# Patient Record
Sex: Female | Born: 1965 | Race: White | Hispanic: No | Marital: Married
Health system: Southern US, Community
[De-identification: ages and names within clinical notes are randomized; demographics above are authoritative.]

---

## 2014-08-19 ENCOUNTER — Inpatient Hospital Stay
Admission: AD | Admit: 2014-08-19 | Payer: Self-pay | Source: Other Acute Inpatient Hospital | Admitting: Internal Medicine

## 2014-08-19 ENCOUNTER — Inpatient Hospital Stay (HOSPITAL_COMMUNITY)
Admission: AD | Admit: 2014-08-19 | Discharge: 2014-09-13 | DRG: 207 | Disposition: E | Payer: Medicaid Other | Source: Other Acute Inpatient Hospital | Attending: Internal Medicine | Admitting: Internal Medicine

## 2014-08-19 DIAGNOSIS — E876 Hypokalemia: Secondary | ICD-10-CM | POA: Diagnosis not present

## 2014-08-19 DIAGNOSIS — Z931 Gastrostomy status: Secondary | ICD-10-CM | POA: Diagnosis not present

## 2014-08-19 DIAGNOSIS — R4020 Unspecified coma: Secondary | ICD-10-CM | POA: Diagnosis not present

## 2014-08-19 DIAGNOSIS — J156 Pneumonia due to other aerobic Gram-negative bacteria: Secondary | ICD-10-CM | POA: Diagnosis not present

## 2014-08-19 DIAGNOSIS — R739 Hyperglycemia, unspecified: Secondary | ICD-10-CM | POA: Diagnosis not present

## 2014-08-19 DIAGNOSIS — R1310 Dysphagia, unspecified: Secondary | ICD-10-CM | POA: Diagnosis present

## 2014-08-19 DIAGNOSIS — E872 Acidosis: Secondary | ICD-10-CM | POA: Diagnosis not present

## 2014-08-19 DIAGNOSIS — G931 Anoxic brain damage, not elsewhere classified: Secondary | ICD-10-CM | POA: Diagnosis present

## 2014-08-19 DIAGNOSIS — J189 Pneumonia, unspecified organism: Secondary | ICD-10-CM

## 2014-08-19 DIAGNOSIS — G253 Myoclonus: Secondary | ICD-10-CM | POA: Diagnosis present

## 2014-08-19 DIAGNOSIS — K7200 Acute and subacute hepatic failure without coma: Secondary | ICD-10-CM | POA: Diagnosis present

## 2014-08-19 DIAGNOSIS — Z515 Encounter for palliative care: Secondary | ICD-10-CM

## 2014-08-19 DIAGNOSIS — D649 Anemia, unspecified: Secondary | ICD-10-CM | POA: Diagnosis present

## 2014-08-19 DIAGNOSIS — G936 Cerebral edema: Secondary | ICD-10-CM | POA: Diagnosis present

## 2014-08-19 DIAGNOSIS — D638 Anemia in other chronic diseases classified elsewhere: Secondary | ICD-10-CM | POA: Diagnosis not present

## 2014-08-19 DIAGNOSIS — E039 Hypothyroidism, unspecified: Secondary | ICD-10-CM | POA: Diagnosis present

## 2014-08-19 DIAGNOSIS — R40243 Glasgow coma scale score 3-8: Secondary | ICD-10-CM

## 2014-08-19 DIAGNOSIS — N390 Urinary tract infection, site not specified: Secondary | ICD-10-CM | POA: Diagnosis not present

## 2014-08-19 DIAGNOSIS — I469 Cardiac arrest, cause unspecified: Secondary | ICD-10-CM

## 2014-08-19 DIAGNOSIS — Z930 Tracheostomy status: Secondary | ICD-10-CM

## 2014-08-19 DIAGNOSIS — Z66 Do not resuscitate: Secondary | ICD-10-CM

## 2014-08-19 DIAGNOSIS — J9690 Respiratory failure, unspecified, unspecified whether with hypoxia or hypercapnia: Secondary | ICD-10-CM | POA: Insufficient documentation

## 2014-08-19 DIAGNOSIS — Y95 Nosocomial condition: Secondary | ICD-10-CM | POA: Diagnosis not present

## 2014-08-19 DIAGNOSIS — Z880 Allergy status to penicillin: Secondary | ICD-10-CM

## 2014-08-19 DIAGNOSIS — J158 Pneumonia due to other specified bacteria: Secondary | ICD-10-CM | POA: Diagnosis not present

## 2014-08-19 DIAGNOSIS — J9620 Acute and chronic respiratory failure, unspecified whether with hypoxia or hypercapnia: Secondary | ICD-10-CM

## 2014-08-19 DIAGNOSIS — J9621 Acute and chronic respiratory failure with hypoxia: Secondary | ICD-10-CM | POA: Diagnosis present

## 2014-08-19 DIAGNOSIS — I248 Other forms of acute ischemic heart disease: Secondary | ICD-10-CM | POA: Diagnosis not present

## 2014-08-19 DIAGNOSIS — J9600 Acute respiratory failure, unspecified whether with hypoxia or hypercapnia: Secondary | ICD-10-CM | POA: Insufficient documentation

## 2014-08-19 DIAGNOSIS — R092 Respiratory arrest: Secondary | ICD-10-CM | POA: Diagnosis present

## 2014-08-19 LAB — MRSA PCR SCREENING: MRSA by PCR: NEGATIVE

## 2014-08-19 MED ORDER — ONDANSETRON HCL 4 MG/2ML IJ SOLN
4.0000 mg | Freq: Four times a day (QID) | INTRAMUSCULAR | Status: DC | PRN
  Administered 2014-08-22: 4 mg via INTRAVENOUS
  Filled 2014-08-19: qty 2

## 2014-08-19 MED ORDER — HEPARIN SODIUM (PORCINE) 5000 UNIT/ML IJ SOLN
5000.0000 [IU] | Freq: Three times a day (TID) | INTRAMUSCULAR | Status: DC
  Administered 2014-08-20 – 2014-08-25 (×16): 5000 [IU] via SUBCUTANEOUS
  Filled 2014-08-19 (×19): qty 1

## 2014-08-19 MED ORDER — SODIUM CHLORIDE 0.9 % IJ SOLN: 10.0000 mL | INTRAMUSCULAR | Status: DC | PRN

## 2014-08-19 MED ORDER — NOREPINEPHRINE BITARTRATE 1 MG/ML IV SOLN
0.0000 ug/min | INTRAVENOUS | Status: DC
  Filled 2014-08-19: qty 4

## 2014-08-19 MED ORDER — SODIUM CHLORIDE 0.9 % IV SOLN: 250.0000 mL | INTRAVENOUS | Status: DC | PRN

## 2014-08-19 MED ORDER — SODIUM CHLORIDE 0.9 % IJ SOLN
10.0000 mL | Freq: Two times a day (BID) | INTRAMUSCULAR | Status: DC
  Administered 2014-08-20: 10 mL
  Administered 2014-08-20: 30 mL
  Administered 2014-08-21 – 2014-08-22 (×3): 10 mL
  Administered 2014-08-22: 20 mL
  Administered 2014-08-23 – 2014-08-24 (×4): 10 mL

## 2014-08-19 MED ORDER — LACTATED RINGERS IV SOLN
INTRAVENOUS | Status: DC
  Administered 2014-08-20: 03:00:00 via INTRAVENOUS

## 2014-08-19 MED ORDER — ALBUTEROL SULFATE (2.5 MG/3ML) 0.083% IN NEBU: 2.5000 mg | INHALATION_SOLUTION | RESPIRATORY_TRACT | Status: DC | PRN

## 2014-08-19 MED ORDER — SODIUM CHLORIDE 0.9 % IV SOLN
2000.0000 mL | Freq: Once | INTRAVENOUS | Status: AC
  Administered 2014-08-20: 1500 mL via INTRAVENOUS

## 2014-08-19 MED ORDER — MIDAZOLAM HCL 2 MG/2ML IJ SOLN
2.0000 mg | INTRAMUSCULAR | Status: DC | PRN
  Administered 2014-08-20 (×2): 2 mg via INTRAVENOUS
  Filled 2014-08-19 (×3): qty 2

## 2014-08-19 MED ORDER — HEPARIN SODIUM (PORCINE) 5000 UNIT/ML IJ SOLN: 5000.0000 [IU] | Freq: Three times a day (TID) | INTRAMUSCULAR | Status: DC

## 2014-08-19 MED ORDER — ACETAMINOPHEN 325 MG PO TABS
650.0000 mg | ORAL_TABLET | ORAL | Status: DC | PRN
  Administered 2014-08-24: 650 mg via ORAL
  Filled 2014-08-19: qty 2

## 2014-08-19 MED ORDER — FENTANYL CITRATE 0.05 MG/ML IJ SOLN
100.0000 ug | INTRAMUSCULAR | Status: DC | PRN
  Administered 2014-08-20 – 2014-08-21 (×3): 100 ug via INTRAVENOUS
  Filled 2014-08-19 (×3): qty 2

## 2014-08-19 MED ORDER — SODIUM CHLORIDE 0.9 % IV BOLUS (SEPSIS)
500.0000 mL | Freq: Once | INTRAVENOUS | Status: AC
  Administered 2014-08-19: 500 mL via INTRAVENOUS

## 2014-08-19 MED ORDER — FENTANYL CITRATE 0.05 MG/ML IJ SOLN
100.0000 ug | INTRAMUSCULAR | Status: AC | PRN
  Administered 2014-08-20 (×3): 100 ug via INTRAVENOUS
  Filled 2014-08-19 (×3): qty 2

## 2014-08-19 MED ORDER — FAMOTIDINE IN NACL 20-0.9 MG/50ML-% IV SOLN
20.0000 mg | Freq: Two times a day (BID) | INTRAVENOUS | Status: DC
  Administered 2014-08-20 – 2014-08-24 (×11): 20 mg via INTRAVENOUS
  Filled 2014-08-19 (×13): qty 50

## 2014-08-19 MED ORDER — MIDAZOLAM HCL 2 MG/2ML IJ SOLN
2.0000 mg | INTRAMUSCULAR | Status: DC | PRN
  Administered 2014-08-20 – 2014-08-21 (×2): 2 mg via INTRAVENOUS
  Filled 2014-08-19: qty 2

## 2014-08-19 NOTE — Procedures (Signed)
Name: Wynn Bankerammie Kopko MRN: [id number] DOB: Jan 04, 1966  DOS:  PROCEDURE NOTE  Procedure:  Arterial catheter placement.  Indications:  Need for invasive hemodynamic monitoring / frequent arterial blood gases measurement.  Consent:  Consent was implied due to the emergency nature of the procedure.  Procedure summary:  The patient was identified as Wynn Bankerammie Keagle and safety timeout was performed. Sterile technique was used. The patient's left groin wrist was prepped using chlorhexidine scrub and the field was draped in usual sterile fashion with protective barrier. The left femoral artery was cannulated without difficulty. Blood was aspirated and the catheter was flushed with normal saline without difficulty. Good arterial waveform was obtained. The catheter was secured into place with sterile dressing.  Complications:  No immediate complications were noted.  Estimated blood loss:  Less then 5 mL  Wadie LessenMario Sherice Ijames, M.D. Pulmonary and Critical Care Medicine [facility] Cell: [phone] Pager: [phone]  08/28/2014, 11:43 PM

## 2014-08-19 NOTE — Progress Notes (Signed)
eLink Physician-Brief Progress Note Patient Name: Miranda Miranda DOB: 01/21/1966 MRN: [id number]   Date of Service  09/08/2014  HPI/Events of Note  [age] F [facility].  Now hypotensive per cuff pressure at 62/52 (55)  eICU Interventions  Plan: 500 cc NS bolus for BP support Place aline for HD monitoring Fellow to see patient     Intervention Category Major Interventions: Hypotension - evaluation and management  DETERDING,ELIZABETH 09/10/2014, 10:25 PM

## 2014-08-19 NOTE — Procedures (Signed)
Central Venous Catheter Insertion Procedure Note Wynn Bankerammie Hornak [id number] Dec 11, 1965  Procedure: Insertion of Central Venous Catheter Indications: Drug and/or fluid administration and Frequent blood sampling  Procedure Details Consent: Unable to obtain consent because of altered level of consciousness. Time Out: Verified patient identification, verified procedure, site/side was marked, verified correct patient position, special equipment/implants available, medications/allergies/relevent history reviewed, required imaging and test results available.  Performed  Maximum sterile technique was used including antiseptics, cap, gloves, gown, hand hygiene, mask and sheet. Skin prep: Chlorhexidine; local anesthetic administered A antimicrobial bonded/coated triple lumen catheter was placed in the left internal jugular vein using the Seldinger technique.  Evaluation Blood flow good Complications: No apparent complications Patient did tolerate procedure well. Chest X-ray ordered to verify placement.  CXR: pending.  Cung Masterson 09/05/2014, 11:40 PM

## 2014-08-19 NOTE — H&P (Signed)
PULMONARY / CRITICAL CARE MEDICINE   Name: Miranda Miranda MRN: [id number] DOB: [birth year]    ADMISSION DATE:  08/27/2014 CONSULTATION DATE:  09/09/2014  REFERRING MD :  Duke [facility]  CHIEF COMPLAINT:  S/p arrest  INITIAL PRESENTATION:   STUDIES:    SIGNIFICANT EVENTS: Cardiorespiratory arrest 08/30/2014   HISTORY OF PRESENT ILLNESS:  Patient unable to give history, history taken from chart review. Basically this is a 4F who was in another hospital for about 1 month for multiple reasons: strep PNA with septic shock, with failure to wean from vent likely from deconditioning/myopathy and resulting in tracheostomy. Also known to have hypothyroidism. Also had dysphagia resulting in PEG tube insertion. Apparently she was just discharged and [facility] and receiving 6L oxygen via cannula to her trache. Later on about 1 hour later staff member found her unresponsive and possibly had no pulse. EMS arrived, 1 epi given and ambubagging started with ROSC. Blood sugar 180, no fever. Now in ICU with borderline BP. No fever. Initial ABG showed hypercarbic resp failure  PAST MEDICAL HISTORY :   has no past medical history on file.  has no past surgical history on file. Prior to Admission medications   Not on File   Allergies not on file  FAMILY HISTORY:  has no family status information on file.  SOCIAL HISTORY:    REVIEW OF SYSTEMS:  Unable to obtain due to altered mental status  SUBJECTIVE:   VITAL SIGNS: Pulse Rate:  [30-85] 85 (02/05 2245) Resp:  [19-26] 26 (02/05 2245) BP: (67-89)/(35-61) 67/49 mmHg (02/05 2245) SpO2:  [100 %] 100 % (02/05 2245) FiO2 (%):  [100 %] 100 % (02/05 2205) HEMODYNAMICS:   VENTILATOR SETTINGS: Vent Mode:  [-] PRVC FiO2 (%):  [100 %] 100 % Set Rate:  [26 bmp] 26 bmp Vt Set:  [500 mL] 500 mL PEEP:  [5 cmH20] 5 cmH20 INTAKE / OUTPUT: No intake or output data in the 24 hours ending 02/20/2015 2344  PHYSICAL EXAMINATION: General:  Trached,  nonverbal Neuro:  Nonverbal, pupils equal and reactive, myoclonic jerks on all extremities HEENT:  trahe present, looks clear, no facial trauma Cardiovascular:  RRR, no loud murmur Lungs:  Clear, no wheeze Abdomen:  Soft, no guardig Musculoskeletal:  Trace edema, no gross deformities Skin:  No ecchymosis  LABS:  CBC No results for input(s): WBC, HGB, HCT, PLT in the last 168 hours. Coag's No results for input(s): APTT, INR in the last 168 hours. BMET No results for input(s): NA, K, CL, CO2, BUN, CREATININE, GLUCOSE in the last 168 hours. Electrolytes No results for input(s): CALCIUM, MG, PHOS in the last 168 hours. Sepsis Markers No results for input(s): LATICACIDVEN, PROCALCITON, O2SATVEN in the last 168 hours. ABG No results for input(s): PHART, PCO2ART, PO2ART in the last 168 hours. Liver Enzymes No results for input(s): AST, ALT, ALKPHOS, BILITOT, ALBUMIN in the last 168 hours. Cardiac Enzymes No results for input(s): TROPONINI, PROBNP in the last 168 hours. Glucose No results for input(s): GLUCAP in the last 168 hours.  Imaging No results found.   ASSESSMENT / PLAN:  PULMONARY Trache A: resp arrest P:   Vent support and wean as tolerated F/U ABG  CARDIOVASCULAR CVL Left IJ 09/09/2014 >>> A: s/p cardiac arrest likely from respiratory arrest as driving factor. ROSC after 1 epi and bagging Borderline hypotension, bedside echo shows hypokinetic LV P:  Echo Pressor PRN IVF resuscitation  RENAL A:  Awaiting bloodwork P:   Correct electrolytes Avoid nephrotoxic  meds  GASTROINTESTINAL A:  No acute issue P:   Start tube feed in AM if able Blood sugar target 140-180  HEMATOLOGIC A:  Awaiting blood work P:  Transfuse if Hb < 7 or if symptomatic  INFECTIOUS A:  Currently does not appears septic although her UA from OSH looked slightly suspicious P:   Repeat pan culture and UA. No need abx for now unless patient deteriorates BCx2  UC  Sputum Abx:    ENDOCRINE A:  Hypothyroidism P:   Resume her maintenance thyroid meds  NEUROLOGIC A:  Encephalopathy likely d/t hypoperfusion from arrest. Possible brain damage given her current myoclonic jerks. P:   Normothermia protocol Avoid medications that alter mental status, PRN meds for sedation and pain control Correct electrolytes as needed RASS goal: 0 to -1    FAMILY  - Updates:   - Inter-disciplinary family meet or Palliative Care meeting due by:     TODAY'S SUMMARY: 33F admitted after a cardiorespiratory arrest, appears to be respiratory driven as a primary cause, arrest was unwitnessed. ROSC after 1 dose of Epi and bagging. Now bordeline BP, does not appear septic. Bedside echo shows hypokinetic LV.    Critical Care time: 35 minutes  Pulmonary and Critical Care Medicine [facility] Pager: [phone]  08/31/2014, 11:44 PM

## 2014-08-20 ENCOUNTER — Inpatient Hospital Stay (HOSPITAL_COMMUNITY): Payer: Medicaid Other

## 2014-08-20 DIAGNOSIS — J9600 Acute respiratory failure, unspecified whether with hypoxia or hypercapnia: Secondary | ICD-10-CM | POA: Insufficient documentation

## 2014-08-20 DIAGNOSIS — J9601 Acute respiratory failure with hypoxia: Secondary | ICD-10-CM

## 2014-08-20 DIAGNOSIS — I469 Cardiac arrest, cause unspecified: Secondary | ICD-10-CM

## 2014-08-20 LAB — COMPREHENSIVE METABOLIC PANEL
ALK PHOS: 107 U/L (ref 39–117)
ALT: 561 U/L — AB (ref 0–35)
ANION GAP: 4 — AB (ref 5–15)
AST: 757 U/L — ABNORMAL HIGH (ref 0–37)
Albumin: 2.1 g/dL — ABNORMAL LOW (ref 3.5–5.2)
BILIRUBIN TOTAL: 0.8 mg/dL (ref 0.3–1.2)
BUN: 18 mg/dL (ref 6–23)
CHLORIDE: 114 mmol/L — AB (ref 96–112)
CO2: 23 mmol/L (ref 19–32)
Calcium: 7.7 mg/dL — ABNORMAL LOW (ref 8.4–10.5)
Creatinine, Ser: 0.45 mg/dL — ABNORMAL LOW (ref 0.50–1.10)
GFR calc Af Amer: >90 mL/min (ref >90)
Glucose, Bld: 113 mg/dL — ABNORMAL HIGH (ref 70–99)
Potassium: 3.9 mmol/L (ref 3.5–5.1)
Sodium: 141 mmol/L (ref 135–145)
Total Protein: 4.4 g/dL — ABNORMAL LOW (ref 6.0–8.3)

## 2014-08-20 LAB — BLOOD GAS, ARTERIAL
ACID-BASE DEFICIT: 3.1 mmol/L — AB (ref 0.0–2.0)
ACID-BASE DEFICIT: 4.4 mmol/L — AB (ref 0.0–2.0)
BICARBONATE: 20.9 meq/L (ref 20.0–24.0)
Bicarbonate: 22.3 mEq/L (ref 20.0–24.0)
Drawn by: 33176
Drawn by: 33176
FIO2: 0.5 %
FIO2: 50 %
LHR: 20 {breaths}/min
MECHVT: 500 mL
O2 SAT: 95.9 %
O2 Saturation: 95.9 %
PATIENT TEMPERATURE: 96.8
PCO2 ART: 41.4 mmHg (ref 35.0–45.0)
PEEP/CPAP: 8 cmH2O
PEEP/CPAP: 8 cmH2O
PH ART: 7.317 — AB (ref 7.350–7.450)
PO2 ART: 85.6 mmHg (ref 80.0–100.0)
Patient temperature: 97.5
RATE: 20 resp/min
TCO2: 22.2 mmol/L (ref 0–100)
TCO2: 23.7 mmol/L (ref 0–100)
VT: 500 mL
pCO2 arterial: 44.6 mmHg (ref 35.0–45.0)
pH, Arterial: 7.315 — ABNORMAL LOW (ref 7.350–7.450)
pO2, Arterial: 83.4 mmHg (ref 80.0–100.0)

## 2014-08-20 LAB — CBC
HCT: 30.5 % — ABNORMAL LOW (ref 36.0–46.0)
Hemoglobin: 9.7 g/dL — ABNORMAL LOW (ref 12.0–15.0)
MCH: 30.8 pg (ref 26.0–34.0)
MCHC: 31.8 g/dL (ref 30.0–36.0)
MCV: 96.8 fL (ref 78.0–100.0)
Platelets: 286 10*3/uL (ref 150–400)
RBC: 3.15 MIL/uL — ABNORMAL LOW (ref 3.87–5.11)
RDW: 14.8 % (ref 11.5–15.5)
WBC: 12.4 10*3/uL — AB (ref 4.0–10.5)

## 2014-08-20 LAB — BASIC METABOLIC PANEL
ANION GAP: 5 (ref 5–15)
BUN: 15 mg/dL (ref 6–23)
CHLORIDE: 116 mmol/L — AB (ref 96–112)
CO2: 22 mmol/L (ref 19–32)
Calcium: 7.8 mg/dL — ABNORMAL LOW (ref 8.4–10.5)
Creatinine, Ser: 0.51 mg/dL (ref 0.50–1.10)
GFR calc Af Amer: >90 mL/min (ref >90)
GFR calc non Af Amer: >90 mL/min (ref >90)
Glucose, Bld: 151 mg/dL — ABNORMAL HIGH (ref 70–99)
POTASSIUM: 3.6 mmol/L (ref 3.5–5.1)
SODIUM: 143 mmol/L (ref 135–145)

## 2014-08-20 LAB — GLUCOSE, CAPILLARY
GLUCOSE-CAPILLARY: 101 mg/dL — AB (ref 70–99)
GLUCOSE-CAPILLARY: 121 mg/dL — AB (ref 70–99)
GLUCOSE-CAPILLARY: 140 mg/dL — AB (ref 70–99)
Glucose-Capillary: 109 mg/dL — ABNORMAL HIGH (ref 70–99)
Glucose-Capillary: 123 mg/dL — ABNORMAL HIGH (ref 70–99)
Glucose-Capillary: 126 mg/dL — ABNORMAL HIGH (ref 70–99)
Glucose-Capillary: 146 mg/dL — ABNORMAL HIGH (ref 70–99)
Glucose-Capillary: 139 mg/dL — ABNORMAL HIGH (ref 70–99)

## 2014-08-20 LAB — CBC WITH DIFFERENTIAL/PLATELET
Basophils Absolute: 0 10*3/uL (ref 0.0–0.1)
Basophils Relative: 0 % (ref 0–1)
EOS ABS: 0 10*3/uL (ref 0.0–0.7)
Eosinophils Relative: 0 % (ref 0–5)
HEMATOCRIT: 29.8 % — AB (ref 36.0–46.0)
HEMOGLOBIN: 9.6 g/dL — AB (ref 12.0–15.0)
Lymphocytes Relative: 8 % — ABNORMAL LOW (ref 12–46)
Lymphs Abs: 0.8 10*3/uL (ref 0.7–4.0)
MCH: 31.1 pg (ref 26.0–34.0)
MCHC: 32.2 g/dL (ref 30.0–36.0)
MCV: 96.4 fL (ref 78.0–100.0)
MONO ABS: 0.9 10*3/uL (ref 0.1–1.0)
Monocytes Relative: 9 % (ref 3–12)
Neutro Abs: 8.5 10*3/uL — ABNORMAL HIGH (ref 1.7–7.7)
Neutrophils Relative %: 83 % — ABNORMAL HIGH (ref 43–77)
Platelets: 296 10*3/uL (ref 150–400)
RBC: 3.09 MIL/uL — ABNORMAL LOW (ref 3.87–5.11)
RDW: 14.8 % (ref 11.5–15.5)
WBC: 10.3 10*3/uL (ref 4.0–10.5)

## 2014-08-20 LAB — PROTIME-INR
INR: 1.22 (ref 0.00–1.49)
INR: 1.37 (ref 0.00–1.49)
PROTHROMBIN TIME: 15.5 s — AB (ref 11.6–15.2)
PROTHROMBIN TIME: 17 s — AB (ref 11.6–15.2)

## 2014-08-20 LAB — URINE MICROSCOPIC-ADD ON

## 2014-08-20 LAB — URINALYSIS, ROUTINE W REFLEX MICROSCOPIC
Bilirubin Urine: NEGATIVE
GLUCOSE, UA: NEGATIVE mg/dL
Ketones, ur: NEGATIVE mg/dL
Nitrite: POSITIVE — AB
Protein, ur: 100 mg/dL — AB
Specific Gravity, Urine: 1.019 (ref 1.005–1.030)
Urobilinogen, UA: 1 mg/dL (ref 0.0–1.0)
pH: 5.5 (ref 5.0–8.0)

## 2014-08-20 LAB — MAGNESIUM
MAGNESIUM: 1.7 mg/dL (ref 1.5–2.5)
MAGNESIUM: 2.6 mg/dL — AB (ref 1.5–2.5)
Magnesium: 1.5 mg/dL (ref 1.5–2.5)

## 2014-08-20 LAB — TYPE AND SCREEN
ABO/RH(D): A POS
ANTIBODY SCREEN: NEGATIVE

## 2014-08-20 LAB — PHOSPHORUS
PHOSPHORUS: 3.4 mg/dL (ref 2.3–4.6)
Phosphorus: 3 mg/dL (ref 2.3–4.6)

## 2014-08-20 LAB — TROPONIN I
TROPONIN I: 0.29 ng/mL — AB (ref <0.031)
Troponin I: 0.36 ng/mL — ABNORMAL HIGH (ref <0.031)
Troponin I: 0.15 ng/mL — ABNORMAL HIGH (ref <0.031)

## 2014-08-20 LAB — LACTIC ACID, PLASMA: LACTIC ACID, VENOUS: 1.8 mmol/L (ref 0.5–2.0)

## 2014-08-20 LAB — ABO/RH: ABO/RH(D): A POS

## 2014-08-20 LAB — APTT: aPTT: 34 seconds (ref 24–37)

## 2014-08-20 MED ORDER — LEVOTHYROXINE SODIUM 100 MCG IV SOLR
44.0000 ug | Freq: Every day | INTRAVENOUS | Status: DC
  Administered 2014-08-20 – 2014-08-21 (×2): 44 ug via INTRAVENOUS
  Filled 2014-08-20 (×2): qty 5

## 2014-08-20 MED ORDER — SODIUM CHLORIDE 0.9 % IV SOLN
INTRAVENOUS | Status: DC
  Administered 2014-08-20: 50 mL/h via INTRAVENOUS
  Administered 2014-08-22: 02:00:00 via INTRAVENOUS

## 2014-08-20 MED ORDER — VANCOMYCIN HCL IN DEXTROSE 1-5 GM/200ML-% IV SOLN
1000.0000 mg | Freq: Three times a day (TID) | INTRAVENOUS | Status: DC
  Administered 2014-08-20 – 2014-08-21 (×4): 1000 mg via INTRAVENOUS
  Filled 2014-08-20 (×6): qty 200

## 2014-08-20 MED ORDER — CETYLPYRIDINIUM CHLORIDE 0.05 % MT LIQD: 7.0000 mL | Freq: Four times a day (QID) | OROMUCOSAL | Status: DC

## 2014-08-20 MED ORDER — CHLORHEXIDINE GLUCONATE 0.12 % MT SOLN: 15.0000 mL | Freq: Two times a day (BID) | OROMUCOSAL | Status: DC

## 2014-08-20 MED ORDER — CETYLPYRIDINIUM CHLORIDE 0.05 % MT LIQD
7.0000 mL | Freq: Four times a day (QID) | OROMUCOSAL | Status: DC
  Administered 2014-08-20 – 2014-08-25 (×20): 7 mL via OROMUCOSAL

## 2014-08-20 MED ORDER — POTASSIUM CHLORIDE 10 MEQ/50ML IV SOLN
10.0000 meq | INTRAVENOUS | Status: AC
  Administered 2014-08-20 (×2): 10 meq via INTRAVENOUS
  Filled 2014-08-20: qty 50

## 2014-08-20 MED ORDER — CHLORHEXIDINE GLUCONATE 0.12 % MT SOLN
15.0000 mL | Freq: Two times a day (BID) | OROMUCOSAL | Status: DC
Start: 2014-08-20 — End: 2014-08-25
  Administered 2014-08-20 – 2014-08-25 (×11): 15 mL via OROMUCOSAL
  Filled 2014-08-20 (×11): qty 15

## 2014-08-20 MED ORDER — INSULIN ASPART 100 UNIT/ML ~~LOC~~ SOLN
0.0000 [IU] | SUBCUTANEOUS | Status: DC
  Administered 2014-08-20 – 2014-08-21 (×7): 2 [IU] via SUBCUTANEOUS

## 2014-08-20 MED ORDER — IPRATROPIUM-ALBUTEROL 0.5-2.5 (3) MG/3ML IN SOLN
3.0000 mL | Freq: Four times a day (QID) | RESPIRATORY_TRACT | Status: DC
  Administered 2014-08-20 – 2014-08-22 (×10): 3 mL via RESPIRATORY_TRACT
  Filled 2014-08-20 (×10): qty 3

## 2014-08-20 MED ORDER — SODIUM CHLORIDE 0.9 % IV SOLN
500.0000 mg | Freq: Four times a day (QID) | INTRAVENOUS | Status: DC
  Administered 2014-08-20 – 2014-08-24 (×16): 500 mg via INTRAVENOUS
  Filled 2014-08-20 (×18): qty 500

## 2014-08-20 MED ORDER — MAGNESIUM SULFATE 50 % IJ SOLN
6.0000 g | Freq: Once | INTRAMUSCULAR | Status: AC
  Administered 2014-08-20: 6 g via INTRAVENOUS
  Filled 2014-08-20: qty 12

## 2014-08-20 MED ORDER — VITAL HIGH PROTEIN PO LIQD
1000.0000 mL | ORAL | Status: DC
  Administered 2014-08-20 – 2014-08-21 (×2): 1000 mL
  Administered 2014-08-22 (×2)
  Administered 2014-08-22 – 2014-08-25 (×4): 1000 mL
  Filled 2014-08-20 (×10): qty 1000

## 2014-08-20 NOTE — Progress Notes (Signed)
ANTIBIOTIC CONSULT NOTE - INITIAL  Pharmacy Consult for Vanc and Primaxin Indication: UTI/HCAP  Allergies  Allergen Reactions  . Amoxicillin Other (See Comments)    unknown    Patient Measurements: Height: 5\' 5"  (165.1 cm) Weight: 187 lb 12.8 oz (85.186 kg) IBW/kg (Calculated) : 57 Adjusted Body Weight: 68.2 kg  Vital Signs: Temp: 95.4 F (35.2 C) (02/06 0900) Temp Source: Core (Comment) (02/06 0900) BP: 149/53 mmHg (02/06 0900) Pulse Rate: 113 (02/06 0900) Intake/Output from previous day: 02/05 0701 - 02/06 0700 In: 610.4 [I.V.:560.4; IV Piggyback:50] Out: 480 [Urine:480] Intake/Output from this shift: Total I/O In: 600 [I.V.:500; IV Piggyback:100] Out: 275 [Urine:275]  Labs:  Recent Labs  05-28-2015 2348 08/20/14 0417  WBC 10.3 12.4*  HGB 9.6* 9.7*  PLT 296 286  CREATININE 0.45* 0.51   Estimated Creatinine Clearance: 90.7 mL/min (by C-G formula based on Cr of 0.51). No results for input(s): VANCOTROUGH, VANCOPEAK, VANCORANDOM, GENTTROUGH, GENTPEAK, GENTRANDOM, TOBRATROUGH, TOBRAPEAK, TOBRARND, AMIKACINPEAK, AMIKACINTROU, AMIKACIN in the last 72 hours.   Microbiology: Recent Results (from the past 720 hour(s))  MRSA PCR Screening     Status: None   Collection Time: 05-28-2015 10:02 PM  Result Value Ref Range Status   MRSA by PCR NEGATIVE NEGATIVE Final    Comment:        The GeneXpert MRSA Assay (FDA approved for NASAL specimens only), is one component of a comprehensive MRSA colonization surveillance program. It is not intended to diagnose MRSA infection nor to guide or monitor treatment for MRSA infections.     Medical History: No past medical history on file.  Assessment: 49yo female admitted late 2/5 s/p cardiac arrest, transferred here via EMS. Pt recently hospitalized for ~475mo for multiple reasons: strep PNA with septic shock, failure to wean from vent likely from deconditioning/myopathy >> tracheostomy.  Pharmacy consulted to dose Vanc and  Primaxin for possible HCAP and UTI, note pt has allergy to PCN/amoxicillin.  Pt afebrile, HR tachy, WBC slightly elevated at 12.4, SCr 0.51  2/6 BCx2 >>  2/6 UCx >>  2/6 trach aspirate >>   Goal of Therapy:  Vancomycin trough level 15-20 mcg/ml  Plan:  Vancomycin 1000mg  IV Q8hr Primaxin 500mg  IV Q6hr Monitor C&S, clinical improvement, renal function Monitor vanc trough at steady state  Waynette Butteryegan K. Lenus Trauger, PharmD Clinical Pharmacy Resident Pager: [phone] 08/20/2014 11:04 AM

## 2014-08-20 NOTE — Progress Notes (Signed)
PULMONARY / CRITICAL CARE MEDICINE   Name: Miranda Miranda MRN: [id number] DOB: [birth year]    ADMISSION DATE:  08/30/2014 CONSULTATION DATE:  08/22/2014  REFERRING MD :  Duke [facility]  CHIEF COMPLAINT:  S/p arrest  INITIAL PRESENTATION: 49 yo female [facility], septic shock with trach/peg discharged to rehab 2/5 . Found Dimple Nanasunrepsonsive /pulseless for unknown time with EMS given epi x 1 w/ ROSC . [facility]   STUDIES:    SIGNIFICANT EVENTS: Cardiorespiratory arrest 09/07/2014   SUBJECTIVE:  Remains unresponsive  B/p improved with IVF ,no pressors needed   VITAL SIGNS: Temp:  [96.1 F (35.6 C)-97.9 F (36.6 C)] 97 F (36.1 C) (02/06 0800) Pulse Rate:  [30-126] 98 (02/06 0800) Resp:  [16-34] 21 (02/06 0800) BP: (67-165)/(35-116) 136/72 mmHg (02/06 0800) SpO2:  [91 %-100 %] 95 % (02/06 0800) Arterial Line BP: (96-173)/(58-102) 136/72 mmHg (02/06 0800) FiO2 (%):  [50 %-100 %] 50 % (02/06 0800) Weight:  [85.186 kg (187 lb 12.8 oz)] 85.186 kg (187 lb 12.8 oz) (02/06 0200) HEMODYNAMICS: CVP:  [6 mmHg-12 mmHg] 6 mmHg VENTILATOR SETTINGS: Vent Mode:  [-] PRVC FiO2 (%):  [50 %-100 %] 50 % Set Rate:  [20 bmp-26 bmp] 20 bmp Vt Set:  [500 mL] 500 mL PEEP:  [5 cmH20-8 cmH20] 8 cmH20 Plateau Pressure:  [25 cmH20-31 cmH20] 25 cmH20 INTAKE / OUTPUT:  Intake/Output Summary (Last 24 hours) at 08/20/14 0937 Last data filed at 08/20/14 0800  Gross per 24 hour  Intake 735.42 ml  Output    480 ml  Net 255.42 ml    PHYSICAL EXAMINATION: General:  Trached, on vent  Neuro:  Pupils sluggish, unresponsive to sternal rub/sxn HEENT:  Trach midline, c/d  Cardiovascular: ST  no loud murmur Lungs:  Decreased BS , no wheeze  Abdomen:  Soft, peg , hypoactive bs  Musculoskeletal:  Trace edema, no gross deformities Skin:  No ecchymosis  LABS:  CBC  Recent Labs Lab 04-13-15 2348 08/20/14 0417  WBC 10.3 12.4*  HGB 9.6* 9.7*  HCT  29.8* 30.5*  PLT 296 286   Coag's  Recent Labs Lab 04-13-15 2348 08/20/14 0842  APTT 34  --   INR 1.37 1.22   BMET  Recent Labs Lab 04-13-15 2348 08/20/14 0417  NA 141 143  K 3.9 3.6  CL 114* 116*  CO2 23 22  BUN 18 15  CREATININE 0.45* 0.51  GLUCOSE 113* 151*   Electrolytes  Recent Labs Lab 04-13-15 2348 08/20/14 0417  CALCIUM 7.7* 7.8*  MG 1.7 1.5  PHOS 3.4 3.0   Sepsis Markers  Recent Labs Lab 04-13-15 2348  LATICACIDVEN 1.8   ABG  Recent Labs Lab 08/20/14 0042 08/20/14 0338  PHART 7.315* 7.317*  PCO2ART 44.6 41.4  PO2ART 85.6 83.4   Liver Enzymes  Recent Labs Lab 04-13-15 2348  AST 757*  ALT 561*  ALKPHOS 107  BILITOT 0.8  ALBUMIN 2.1*   Cardiac Enzymes  Recent Labs Lab 04-13-15 2348 08/20/14 0417  TROPONINI 0.36* 0.29*   Glucose  Recent Labs Lab 08/20/14 0012 08/20/14 0420 08/20/14 0539 08/20/14 0747  GLUCAP 109* 140* 123* 126*    Imaging No results found.   ASSESSMENT / PLAN:  PULMONARY Trache A: Acute Hypoxic Respiratory Failure s/p arrest ? Possible event vs plugging  Chronic Trach dependent with prolonged illness.  Right Basilar atx possible developing HCAP ?aspiration event   P:   Vent support and wean  as tolerated F/U ABG Follow cx data  Check cxr in am  Begin Abx see ID sxn  Cont BD   CARDIOVASCULAR CVL Left IJ September 10, 2014 >>> A: s/p cardiac arrest likely from respiratory arrest as driving factor. ROSC after 1 epi and bagging Borderline hypotension, bedside echo shows hypokinetic LV Elevated Troponin ?demand >trop tr down  2/6 > b/p improved , no pressors needed, echo pending   P:  Echo pending  Cont IVF  Hep DVT   RENAL A:   P:   Correct electrolytes Avoid nephrotoxic meds  GASTROINTESTINAL A:  Increased LFT ? Shock liver , INR nml  P:   Begin TF   Repeat LFT in am   cont pepcid   HEMATOLOGIC A:  Awaiting blood work P:  Transfuse if Hb < 7 or if symptomatic  INFECTIOUS A:  UTI  -urine positive   P:    BCx2 2/6>> UC 2/6 >> Sputum 2/6 >> Abx:  Vanc 2/6 >   ENDOCRINE A:  Hypothyroidism Hyperglycemia  P:   Resume her maintenance thyroid meds SSI   NEUROLOGIC A:  Encephalopathy likely d/t hypoperfusion from arrest. Possible brain damage given her current myoclonic jerks. P:   Normothermia protocol Avoid medications that alter mental status, PRN meds for sedation and pain control Correct electrolytes as needed RASS goal: 0 to -1    FAMILY  - Updates: no family at bedside   - Inter-disciplinary family meet or Palliative Care meeting due by: 2/13    TODAY'S SUMMARY: 22F admitted after a cardiorespiratory arrest, appears to be respiratory driven as a primary cause, arrest was unwitnessed. ROSC after 1 dose of Epi and bagging. B/p improved, no pressors required . Remains unresponsive , needs CT head .  Troponin bump c/w demand ischemia-follow echo, trop tr down.      [facility] NP-C   Pulmonary and Critical Care Medicine [facility] Pager: [phone]  08/20/2014, 9:37 AM

## 2014-08-20 NOTE — Progress Notes (Signed)
Patient arrived from [place] hospital with foley already in place. Urine was cloudy and urine analysis was positive for UTI.

## 2014-08-20 NOTE — Progress Notes (Signed)
eLink Physician-Brief Progress Note Patient Name: Miranda Miranda DOB: [birth year] MRN: [id number]   Date of Service  08/20/2014  HPI/Events of Note  Hyperglycemia  eICU Interventions  Placed on q4 hour SSI while intubated and NPO     Intervention Category Intermediate Interventions: Hyperglycemia - evaluation and treatment  Keiry Kowal 08/20/2014, 5:13 AM

## 2014-08-20 NOTE — Progress Notes (Signed)
[facility] FOR AM LAB REPLACEMENT ONLY  The patient does apply for the [facility] Electrolyte [facility]plac[facility] Protocol based on the criteria listed below:   1. Is GFR >/= 40 ml/min? Yes.    Patient's GFR today is >90 2. Is urine output >/= 0.5 ml/kg/hr for the last 6 hours? Yes.   Patient's UOP is 0.96 ml/kg/hr 3. Is BUN < 60 mg/dL? Yes.    Patient's BUN today is 15 4. Abnormal electrolyte(s): K 3.6, Magnesium 1.5 5. Ordered repletion with: [facility] adult ICU replacement protocol 6. If a panic level lab has been reported, has the CCM MD in charge been notified? Yes.  .   Physician:  Dr. Lanora ManisElizabeth Deterding  [facility]ZAH, Alda BertholdYOUNKAI E 08/20/2014 5:59 AM

## 2014-08-20 NOTE — Progress Notes (Signed)
INITIAL NUTRITION ASSESSMENT  DOCUMENTATION CODES Per approved criteria  -Obesity Unspecified   INTERVENTION: Initiate Vital High Protein @ 20 ml/hr via PEG and increase by 10 ml every 4 hours to goal rate of 55 ml/hr.   Tube feeding regimen provides 1320 kcal, 116 grams of protein, and 1100 ml of H2O.   NUTRITION DIAGNOSIS: Inadequate oral intake related to inability to eat as evidenced by NPO status  Goal: Enteral nutrition to provide 60-70% of estimated calorie needs (22-25 kcals/kg ideal body weight) and 100% of estimated protein needs, based on ASPEN guidelines for hypocaloric, high protein feeding in critically ill obese individuals  Monitor:  Tf initiation, tf tolerance, weight, labs   Reason for Assessment: Nutritional Assessment and TF recommendations/ new ventilator  49 y.o. female  Admitting Dx: <principal problem not specified>  ASSESSMENT: 34F who was  Recently in other hospital for strep PNA with septic shock, with failure to wean from vent resulting in tracheostomy. Also known to have hypothyroidism. Also had dysphagia resulting in PEG tube insertion. Discharged and [facility] and receiving 6L oxygen via cannula to her trache. Later found her unresponsive and possibly had no pulse. Hypercarbic resp failure  Pt has chronic Peg. Placed TF recs    Patient is currently intubated on ventilator support MV: 10.2 L/min Temp (24hrs), Avg:97 F (36.1 C), Min:96.1 F (35.6 C), Max:97.9 F (36.6 C)  Nutrition Focused Physical Exam: Difficult to assess d/t cooling pads and obesity Subcutaneous Fat:  Orbital Region:Unable to determine Upper Arm Region: Unable to determine Thoracic and Lumbar Region: Unable to determine Muscle:  Temple Region: Mild Clavicle Bone Region: Unable to determine Clavicle and Acromion Bone Region: Unable to determine Scapular Bone Region: Unable to determine Dorsal Hand: unable to determine Patellar Region: Unable to  determine Anterior Thigh Region: Unable to determine Posterior Calf Region: Unable to determine  Edema: None  Height: Ht Readings from Last 1 Encounters:  08/20/14 5\' 5"  (1.651 m)    Weight: Wt Readings from Last 1 Encounters:  08/20/14 187 lb 12.8 oz (85.186 kg)    Ideal Body Weight: 125 lbs   % Ideal Body Weight: 150%  Wt Readings from Last 10 Encounters:  08/20/14 187 lb 12.8 oz (85.186 kg)    Usual Body Weight: unable to determine  BMI:  Body mass index is 31.25 kg/(m^2).  Estimated Nutritional Needs: Kcal: 1670(1250-1420, 22-25 IBW) Protein: 114 Fluid: per md  Skin: WDL  Diet Order: Diet NPO time specified  EDUCATION NEEDS: -Education not appropriate at this time   Intake/Output Summary (Last 24 hours) at 08/20/14 0815 Last data filed at 08/20/14 0800  Gross per 24 hour  Intake 735.42 ml  Output    480 ml  Net 255.42 ml    Last BM: Unknown  Labs:   Recent Labs Lab February 22, 2015 2348 08/20/14 0417  NA 141 143  K 3.9 3.6  CL 114* 116*  CO2 23 22  BUN 18 15  CREATININE 0.45* 0.51  CALCIUM 7.7* 7.8*  MG 1.7 1.5  PHOS 3.4 3.0  GLUCOSE 113* 151*    CBG (last 3)   Recent Labs  08/20/14 0012 08/20/14 0420  GLUCAP 109* 140*    Scheduled Meds: . antiseptic oral rinse  7 mL Mouth Rinse QID  . chlorhexidine  15 mL Mouth Rinse BID  . famotidine (PEPCID) IV  20 mg Intravenous Q12H  . heparin  5,000 Units Subcutaneous 3 times per day  . insulin aspart  0-15 Units  Subcutaneous 6 times per day  . ipratropium-albuterol  3 mL Nebulization Q6H  . levothyroxine  44 mcg Intravenous Daily  . magnesium sulfate LVP 250-500 ml  6 g Intravenous Once  . potassium chloride  10 mEq Intravenous Q1 Hr x 2  . sodium chloride  10-40 mL Intracatheter Q12H    Continuous Infusions: . lactated ringers 125 mL/hr at 08/20/14 0231  . norepinephrine (LEVOPHED) Adult infusion      No past medical history on file.  No past surgical history on file.  Christophe Louis RD, LDN Nutrition Pager: [phone] 08/20/2014 8:15 AM

## 2014-08-20 NOTE — Progress Notes (Signed)
STAFF NOTE: I, Miranda Lavinia SharpsM Danylle Ouk have personally reviewed patient's Miranda Miranda  available data, including medical history, events of note, physical examination and test results as part of my evaluation. I have discussed with resident/NP and other care providers such as pharmacist, RN and RRT.  In addition,  I personally evaluated patient and elicited key findings of chronic critical illness with s/ptrach [facility]. Now on normothermia.  She is sedated.y. Agree with broad antibiotics today. No family at bedside.  Rest per NP/medical resident whose note is outlined above and that I agree with  The patient is critically ill with multiple organ systems failure and requires high complexity decision making for assessment and support, frequent evaluation and titration of therapies, application of advanced monitoring technologies and extensive interpretation of multiple databases.   Critical Care Time devoted to patient care services described in this note is  30  Minutes. This time reflects time of care of this signee Miranda Miranda. This critical care time does not reflect procedure time, or teaching time or supervisory time of PA/NP/Med student/Med Resident etc but could involve care discussion time    Miranda Miranda, M.D., [facility]C.P Pulmonary and Critical Care Medicine Staff Physician [facility] [place] Pulmonary and Critical Care Pager: [phone], If no answer or between  15:00h - 7:00h: call [phone]  08/20/2014 2:12 PM

## 2014-08-21 ENCOUNTER — Inpatient Hospital Stay (HOSPITAL_COMMUNITY): Payer: Medicaid Other

## 2014-08-21 DIAGNOSIS — R40243 Glasgow coma scale score 3-8: Secondary | ICD-10-CM

## 2014-08-21 DIAGNOSIS — J9690 Respiratory failure, unspecified, unspecified whether with hypoxia or hypercapnia: Secondary | ICD-10-CM | POA: Insufficient documentation

## 2014-08-21 DIAGNOSIS — I469 Cardiac arrest, cause unspecified: Secondary | ICD-10-CM

## 2014-08-21 DIAGNOSIS — J9620 Acute and chronic respiratory failure, unspecified whether with hypoxia or hypercapnia: Secondary | ICD-10-CM

## 2014-08-21 DIAGNOSIS — R4020 Unspecified coma: Secondary | ICD-10-CM | POA: Insufficient documentation

## 2014-08-21 LAB — GLUCOSE, CAPILLARY
GLUCOSE-CAPILLARY: 119 mg/dL — AB (ref 70–99)
Glucose-Capillary: 138 mg/dL — ABNORMAL HIGH (ref 70–99)
Glucose-Capillary: 148 mg/dL — ABNORMAL HIGH (ref 70–99)
Glucose-Capillary: 116 mg/dL — ABNORMAL HIGH (ref 70–99)
Glucose-Capillary: 112 mg/dL — ABNORMAL HIGH (ref 70–99)

## 2014-08-21 LAB — POCT I-STAT 3, ART BLOOD GAS (G3+)
Acid-base deficit: 4 mmol/L — ABNORMAL HIGH (ref 0.0–2.0)
Bicarbonate: 20.6 mEq/L (ref 20.0–24.0)
O2 Saturation: 90 %
PCO2 ART: 37.2 mmHg (ref 35.0–45.0)
PH ART: 7.352 (ref 7.350–7.450)
PO2 ART: 62 mmHg — AB (ref 80.0–100.0)
Patient temperature: 37
TCO2: 22 mmol/L (ref 0–100)

## 2014-08-21 LAB — CBC
HCT: 28.7 % — ABNORMAL LOW (ref 36.0–46.0)
Hemoglobin: 9.3 g/dL — ABNORMAL LOW (ref 12.0–15.0)
MCH: 30.6 pg (ref 26.0–34.0)
MCHC: 32.4 g/dL (ref 30.0–36.0)
MCV: 94.4 fL (ref 78.0–100.0)
PLATELETS: 267 10*3/uL (ref 150–400)
RBC: 3.04 MIL/uL — ABNORMAL LOW (ref 3.87–5.11)
RDW: 15 % (ref 11.5–15.5)
WBC: 11.4 10*3/uL — ABNORMAL HIGH (ref 4.0–10.5)

## 2014-08-21 LAB — COMPREHENSIVE METABOLIC PANEL
ALBUMIN: 2.1 g/dL — AB (ref 3.5–5.2)
ALT: 326 U/L — ABNORMAL HIGH (ref 0–35)
ANION GAP: 4 — AB (ref 5–15)
AST: 112 U/L — AB (ref 0–37)
Alkaline Phosphatase: 112 U/L (ref 39–117)
BILIRUBIN TOTAL: 0.6 mg/dL (ref 0.3–1.2)
BUN: 9 mg/dL (ref 6–23)
CO2: 26 mmol/L (ref 19–32)
Calcium: 8.4 mg/dL (ref 8.4–10.5)
Chloride: 111 mmol/L (ref 96–112)
Creatinine, Ser: 0.38 mg/dL — ABNORMAL LOW (ref 0.50–1.10)
GFR calc Af Amer: >90 mL/min (ref >90)
GFR calc non Af Amer: >90 mL/min (ref >90)
Glucose, Bld: 133 mg/dL — ABNORMAL HIGH (ref 70–99)
Potassium: 3.2 mmol/L — ABNORMAL LOW (ref 3.5–5.1)
Sodium: 141 mmol/L (ref 135–145)
Total Protein: 5.1 g/dL — ABNORMAL LOW (ref 6.0–8.3)

## 2014-08-21 LAB — VANCOMYCIN, TROUGH: VANCOMYCIN TR: 23.6 ug/mL — AB (ref 10.0–20.0)

## 2014-08-21 LAB — MAGNESIUM: MAGNESIUM: 2.3 mg/dL (ref 1.5–2.5)

## 2014-08-21 LAB — PHOSPHORUS: Phosphorus: 2.1 mg/dL — ABNORMAL LOW (ref 2.3–4.6)

## 2014-08-21 MED ORDER — POTASSIUM PHOSPHATES 15 MMOLE/5ML IV SOLN
30.0000 mmol | Freq: Once | INTRAVENOUS | Status: AC
  Administered 2014-08-21: 30 mmol via INTRAVENOUS
  Filled 2014-08-21: qty 10

## 2014-08-21 MED ORDER — VANCOMYCIN HCL IN DEXTROSE 750-5 MG/150ML-% IV SOLN
750.0000 mg | Freq: Three times a day (TID) | INTRAVENOUS | Status: DC
  Administered 2014-08-21 – 2014-08-23 (×5): 750 mg via INTRAVENOUS
  Filled 2014-08-21 (×7): qty 150

## 2014-08-21 MED ORDER — LEVOTHYROXINE SODIUM 88 MCG PO TABS
88.0000 ug | ORAL_TABLET | Freq: Every day | ORAL | Status: DC
Start: 2014-08-22 — End: 2014-08-24
  Administered 2014-08-22 – 2014-08-24 (×3): 88 ug
  Filled 2014-08-21 (×4): qty 1

## 2014-08-21 MED ORDER — FUROSEMIDE 10 MG/ML IJ SOLN
40.0000 mg | Freq: Once | INTRAMUSCULAR | Status: AC
Start: 2014-08-21 — End: 2014-08-21
  Administered 2014-08-21: 40 mg via INTRAVENOUS
  Filled 2014-08-21: qty 4

## 2014-08-21 NOTE — Progress Notes (Signed)
  Echocardiogram 2D Echocardiogram has been performed.  Aris EvertsRix, Thao Bauza A 08/21/2014, 11:29 AM

## 2014-08-21 NOTE — Progress Notes (Signed)
eLink Physician-Brief Progress Note Patient Name: Miranda Miranda DOB: [birth year] MRN: [id number]   Date of Service  08/21/2014  HPI/Events of Note  Hypokalemia and hypophosphatemia  eICU Interventions  Potassium and phos replaced     Intervention Category Intermediate Interventions: Electrolyte abnormality - evaluation and management  Miranda Miranda 08/21/2014, 5:20 AM

## 2014-08-21 NOTE — Progress Notes (Signed)
PULMONARY / CRITICAL CARE MEDICINE   Name: Miranda Miranda MRN: [id number] DOB: Nov 27, 1965    ADMISSION DATE:  09/04/2014 CONSULTATION DATE:  08/20/2014  REFERRING MD :  Duke Salviaandolph  CHIEF COMPLAINT:  S/p arrest  INITIAL PRESENTATION: 49 yo female [facility]  for strep PNA, septic shock, with trach/peg discharged to rehab 2/5 . Found [facility]un[facility]psonsive /pulseless for unknown time with EMS given epi x 1 w/ ROSC . [facility].   STUDIES:    SIGNIFICANT EVENTS: Cardiorespiratory arrest 09/03/2014   SUBJECTIVE:  Remains unresponsive , not following commands Sedation given for increased wob and agitation.    VITAL SIGNS: Temp:  [95.2 F (35.1 C)-98.8 F (37.1 C)] 98.6 F (37 C) (02/07 0800) Pulse Rate:  [30-139] 116 (02/07 1000) Resp:  [0-36] 24 (02/07 1000) BP: (89-175)/(62-107) 140/84 mmHg (02/07 1000) SpO2:  [90 %-100 %] 96 % (02/07 1000) Arterial Line BP: (132-167)/(70-137) 141/88 mmHg (02/07 0615) FiO2 (%):  [40 %-60 %] 40 % (02/07 1000) Weight:  [87 kg (191 lb 12.8 oz)] 87 kg (191 lb 12.8 oz) (02/07 0407) HEMODYNAMICS: CVP:  [7 mmHg-22 mmHg] 10 mmHg VENTILATOR SETTINGS: Vent Mode:  [-] PRVC FiO2 (%):  [40 %-60 %] 40 % Set Rate:  [20 bmp] 20 bmp Vt Set:  [500 mL] 500 mL PEEP:  [8 cmH20] 8 cmH20 Plateau Pressure:  [8 cmH20-27 cmH20] 24 cmH20 INTAKE / OUTPUT:  Intake/Output Summary (Last 24 hours) at 08/21/14 1005 Last data filed at 08/21/14 1000  Gross per 24 hour  Intake   3425 ml  Output   1235 ml  Net   2190 ml    PHYSICAL EXAMINATION: General:  Trached, on vent  Neuro:  Pupils sluggish, unresponsive to sternal rub/sxn HEENT:  Trach midline, c/d  Cardiovascular: ST  no loud murmur Lungs: few crackles noted  , no wheeze  Abdomen:  Soft, peg , hypoactive bs  Musculoskeletal:  Trace edema, no gross deformities Skin:  No ecchymosis  LABS:  CBC  Recent Labs Lab 09-09-2014 2348 08/20/14 0417 08/21/14 0314  WBC 10.3  12.4* 11.4*  HGB 9.6* 9.7* 9.3*  HCT 29.8* 30.5* 28.7*  PLT 296 286 267   Coag's  Recent Labs Lab 09-09-2014 2348 08/20/14 0842  APTT 34  --   INR 1.37 1.22   BMET  Recent Labs Lab 09-09-2014 2348 08/20/14 0417 08/21/14 0314  NA 141 143 141  K 3.9 3.6 3.2*  CL 114* 116* 111  CO2 23 22 26   BUN 18 15 9   CREATININE 0.45* 0.51 0.38*  GLUCOSE 113* 151* 133*   Electrolytes  Recent Labs Lab 09-09-2014 2348 08/20/14 0417 08/20/14 1830 08/21/14 0314  CALCIUM 7.7* 7.8*  --  8.4  MG 1.7 1.5 2.6* 2.3  PHOS 3.4 3.0  --  2.1*   Sepsis Markers  Recent Labs Lab 09-09-2014 2348  LATICACIDVEN 1.8   ABG  Recent Labs Lab 08/20/14 0042 08/20/14 0338 08/21/14 0354  PHART 7.315* 7.317* 7.352  PCO2ART 44.6 41.4 37.2  PO2ART 85.6 83.4 62.0*   Liver Enzymes  Recent Labs Lab 09-09-2014 2348 08/21/14 0314  AST 757* 112*  ALT 561* 326*  ALKPHOS 107 112  BILITOT 0.8 0.6  ALBUMIN 2.1* 2.1*   Cardiac Enzymes  Recent Labs Lab 09-09-2014 2348 08/20/14 0417 08/20/14 1800  TROPONINI 0.36* 0.29* 0.15*   Glucose  Recent Labs Lab 08/20/14 1159 08/20/14 1607 08/20/14 1957 08/20/14 2330 08/21/14 0353 08/21/14 0813  GLUCAP 146* 121*  139* 101* 138* 119*    Imaging Dg Chest Port 1 View  08/20/2014   CLINICAL DATA:  49 year old female with acute respiratory failure  EXAM: PORTABLE CHEST - 1 VIEW  COMPARISON:  Prior chest x-ray earlier today at 12:08 a.m.  FINDINGS: Stable tracheostomy tube with the tip midline and at the level of the clavicles. Left IJ approach central venous catheter. Catheter tip projects over the upper SVC. Stable cardiac and mediastinal contours. Slightly improved aeration in the left perihilar and right basilar regions. Persistent diffuse bilateral interstitial and airspace opacities. Continue dense opacification of the left lung base. No acute osseous abnormality.  IMPRESSION: 1. Interval improvement in aeration in the right base and left perihilar region.  2. Persistent dense left basilar opacity and diffuse bilateral interstitial and airspace disease. 3. Stable support apparatus.   Electronically Signed   By: Malachy Moan M.D.   On: 08/20/2014 14:44   Dg Chest Port 1 View  08/20/2014   CLINICAL DATA:  Respiratory failure.  EXAM: PORTABLE CHEST - 1 VIEW  COMPARISON:  One day prior at 1803 hr  FINDINGS: Tracheostomy tube remains the thoracic inlet. There are multiple overlying monitoring devices, there is a probable left internal jugular central venous catheter tip at the proximal SVC. Cardiomegaly is unchanged. Interstitial edema is unchanged. Left pleural effusion and atelectasis, unchanged. There is worsening right atelectasis. No pneumothorax.  IMPRESSION: 1. Worsening atelectasis at the right lung base. 2. Unchanged cardiomegaly, interstitial edema, left pleural effusion and atelectasis. 3. New left internal jugular central venous catheter with tip over the SVC.   Electronically Signed   By: Rubye Oaks M.D.   On: 08/20/2014 01:08     ASSESSMENT / PLAN:  PULMONARY Trache A: Acute Hypoxic Respiratory Failure s/p arrest ? 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 Base, +interstitial edema   P:   Vent support and wean as tolerated  Follow cx data  Check cxr in am   Abx see ID sxn  Cont BD  Lasix  x 1   CARDIOVASCULAR CVL Left IJ 09/05/2014 >>> A: s/p cardiac arrest likely from respiratory arrest as driving factor. ROSC after 1 epi and bagging Borderline hypotension, bedside echo shows hypokinetic LV>resolved  Elevated Troponin ?demand >trop tr down  2/7 > b/p improved ,   echo pending , +2L bal   P:  Echo pending  Decrease IVF to [facility]  Hep DVT  Check bnp in am   RENAL A:  Hypomag Hypophos   P:   Correct electrolytes Avoid nephrotoxic  meds  GASTROINTESTINAL A:  Increased LFT ? Shock liver , INR nml  2/7 : LFT tr down  P:     TF   Repeat LFT in am   cont pepcid   HEMATOLOGIC A: Anemia -no sign of active bleeding  P:  Transfuse if Hb < 7 or if symptomatic  INFECTIOUS A:  UTI -urine positive   P:    BCx2 2/6>> UC 2/6 >> Sputum 2/6 >> Abx:  Vanc 2/6 > Primaxin 2/6 >   ENDOCRINE A:  Hypothyroidism Hyperglycemia  P:   Maintenance thyroid meds via peg  SSI  Check tsh in am   NEUROLOGIC A:  Encephalopathy likely d/t hypoperfusion from arrest. Possible brain damage given her current myoclonic jerks. P:   Normothermia protocol Avoid medications that alter mental  status, PRN meds for sedation and pain control Correct electrolytes as needed RASS goal: 0 to -1 Consider CT head     FAMILY  - Updates: no family at bedside   - Inter-disciplinary family meet or Palliative Care meeting due by: 2/13    TODAY'S SUMMARY: 54F admitted after a cardiorespiratory arrest, appears to be respiratory driven as a primary cause, arrest was unwitnessed. ROSC after 1 dose of Epi and bagging.  . Remains unresponsive , needs CT head .  Troponin bump c/w demand ischemia-follow echo, trop tr down.      [facility] NP-C   Pulmonary and Critical Care Medicine [facility] Pager: [phone]  08/21/2014, 10:05 AM

## 2014-08-21 NOTE — Progress Notes (Signed)
ANTIBIOTIC CONSULT NOTE - FOLLOW UP  Pharmacy Consult for Vancomycin Indication: UTI/HCAP  Allergies  Allergen Reactions  . Amoxicillin Other (See Comments)    unknown    Patient Measurements: Height: 5\' 5"  (165.1 cm) Weight: 191 lb 12.8 oz (87 kg) (subtracted the weight of pads w water from weight on bed. ) IBW/kg (Calculated) : 57 Adjusted Body Weight: n/a  Vital Signs: Temp: 98.6 F (37 C) (02/07 1800) Temp Source: Core (Comment) (02/07 1800) BP: 97/61 mmHg (02/07 2000) Pulse Rate: 85 (02/07 2000) Intake/Output from previous day: 02/06 0701 - 02/07 0700 In: 3485 [I.V.:1630; NG/GT:650; IV Piggyback:1205] Out: 1385 [Urine:1385] Intake/Output from this shift: Total I/O In: 75 [Other:30; NG/GT:45] Out: -   Labs:  Recent Labs  01/25/15 2348 08/20/14 0417 08/21/14 0314  WBC 10.3 12.4* 11.4*  HGB 9.6* 9.7* 9.3*  PLT 296 286 267  CREATININE 0.45* 0.51 0.38*   Estimated Creatinine Clearance: 91.6 mL/min (by C-G formula based on Cr of 0.38).  Recent Labs  08/21/14 1849  VANCOTROUGH 23.6*     Microbiology: Recent Results (from the past 720 hour(s))  MRSA PCR Screening     Status: None   Collection Time: 01/25/15 10:02 PM  Result Value Ref Range Status   MRSA by PCR NEGATIVE NEGATIVE Final    Comment:        The GeneXpert MRSA Assay (FDA approved for NASAL specimens only), is one component of a comprehensive MRSA colonization surveillance program. It is not intended to diagnose MRSA infection nor to guide or monitor treatment for MRSA infections.   Culture, blood (routine x 2)     Status: None (Preliminary result)   Collection Time: 08/20/14 12:05 AM  Result Value Ref Range Status   Specimen Description BLOOD LEFT A-LINE  Final   Special Requests BOTTLES DRAWN AEROBIC AND ANAEROBIC 5CC EACH  Final   Culture   Final           BLOOD CULTURE RECEIVED NO GROWTH TO DATE CULTURE WILL BE HELD FOR 5 DAYS BEFORE ISSUING A FINAL NEGATIVE REPORT Note: Culture  results may be compromised due to an excessive volume of blood received in culture bottles. Performed at [facility]    Report Status PENDING  Incomplete  Culture, blood (routine x 2)     Status: None (Preliminary result)   Collection Time: 08/20/14 12:10 AM  Result Value Ref Range Status   Specimen Description BLOOD LEFT PORTA CATH  Final   Special Requests BOTTLES DRAWN AEROBIC AND ANAEROBIC 5CC   Final   Culture   Final           BLOOD CULTURE RECEIVED NO GROWTH TO DATE CULTURE WILL BE HELD FOR 5 DAYS BEFORE ISSUING A FINAL NEGATIVE REPORT Performed at [facility]    Report Status PENDING  Incomplete  Urine culture     Status: None (Preliminary result)   Collection Time: 08/20/14  2:41 AM  Result Value Ref Range Status   Specimen Description URINE, CATHETERIZED  Final   Special Requests NONE  Final   Colony Count   Final    40,000 COLONIES/ML Performed at [facility]    Culture   Final    GRAM NEGATIVE RODS Performed at [facility]    Report Status PENDING  Incomplete  Culture, respiratory (NON-Expectorated)     Status: None (Preliminary result)   Collection Time: 08/20/14  3:15 AM  Result Value Ref Range Status   Specimen Description TRACHEAL ASPIRATE  Final   Special Requests NONE  Final   Gram Stain PENDING  Incomplete   Culture   Final    Culture reincubated for better growth Performed at [facility]    Report Status PENDING  Incomplete    Anti-infectives    Start     Dose/Rate Route Frequency Ordered Stop   08/21/14 2300  vancomycin (VANCOCIN) IVPB 750 mg/150 ml premix     750 mg150 mL/hr over 60 Minutes Intravenous Every 8 hours 08/21/14 2133     08/20/14 1200  imipenem-cilastatin (PRIMAXIN) 500 mg in sodium chloride 0.9 % 100 mL IVPB     500 mg200 mL/hr over 30 Minutes Intravenous 4 times per day 08/20/14 1107     08/20/14 1130  vancomycin (VANCOCIN) IVPB 1000 mg/200 mL premix  Status:  Discontinued     1,000 mg200  mL/hr over 60 Minutes Intravenous Every 8 hours 08/20/14 1107 08/21/14 2133      Assessment: 49yo female admitted late 2/5 s/p cardiac arrest, transferred here via EMS. Pt recently hospitalized for ~65mo for multiple reasons: strep PNA with septic shock, failure to wean from vent likely from deconditioning/myopathy >> tracheostomy. Pharmacy consulted to dose Vanc and Primaxin for possible HCAP and UTI, note pt has allergy to PCN/amoxicillin.  Vancomycin trough a little supratherapeutic tonight.  It appears trough was drawn appropriately.  Goal of Therapy:  Vancomycin trough level 15-20 mcg/ml  Plan:  1. Decrease IV vancomycin to 750 mg IV q 8 hrs.  Next dose at 11 PM. 2. Will recheck trough at steady state as indicated.  Tad Moore, BCPS  Clinical Pharmacist Pager [phone]  08/21/2014 9:36 PM

## 2014-08-22 ENCOUNTER — Inpatient Hospital Stay (HOSPITAL_COMMUNITY): Payer: Medicaid Other

## 2014-08-22 DIAGNOSIS — J9600 Acute respiratory failure, unspecified whether with hypoxia or hypercapnia: Secondary | ICD-10-CM

## 2014-08-22 DIAGNOSIS — Z66 Do not resuscitate: Secondary | ICD-10-CM

## 2014-08-22 LAB — COMPREHENSIVE METABOLIC PANEL
ALBUMIN: 1.9 g/dL — AB (ref 3.5–5.2)
ALT: 193 U/L — ABNORMAL HIGH (ref 0–35)
AST: 34 U/L (ref 0–37)
Alkaline Phosphatase: 114 U/L (ref 39–117)
Anion gap: 5 (ref 5–15)
BUN: 12 mg/dL (ref 6–23)
CO2: 30 mmol/L (ref 19–32)
Calcium: 8.3 mg/dL — ABNORMAL LOW (ref 8.4–10.5)
Chloride: 103 mmol/L (ref 96–112)
Creatinine, Ser: 0.35 mg/dL — ABNORMAL LOW (ref 0.50–1.10)
GFR calc Af Amer: >90 mL/min (ref >90)
GFR calc non Af Amer: >90 mL/min (ref >90)
Glucose, Bld: 108 mg/dL — ABNORMAL HIGH (ref 70–99)
Potassium: 2.6 mmol/L — CL (ref 3.5–5.1)
Sodium: 138 mmol/L (ref 135–145)
Total Bilirubin: 0.6 mg/dL (ref 0.3–1.2)
Total Protein: 5 g/dL — ABNORMAL LOW (ref 6.0–8.3)

## 2014-08-22 LAB — CBC
HCT: 27.1 % — ABNORMAL LOW (ref 36.0–46.0)
HEMOGLOBIN: 8.9 g/dL — AB (ref 12.0–15.0)
MCH: 31.4 pg (ref 26.0–34.0)
MCHC: 32.8 g/dL (ref 30.0–36.0)
MCV: 95.8 fL (ref 78.0–100.0)
Platelets: 261 10*3/uL (ref 150–400)
RBC: 2.83 MIL/uL — AB (ref 3.87–5.11)
RDW: 15 % (ref 11.5–15.5)
WBC: 9.1 10*3/uL (ref 4.0–10.5)

## 2014-08-22 LAB — BRAIN NATRIURETIC PEPTIDE: B Natriuretic Peptide: 414 pg/mL — ABNORMAL HIGH (ref 0.0–100.0)

## 2014-08-22 LAB — URINE CULTURE: Colony Count: 40000

## 2014-08-22 LAB — PHOSPHORUS: Phosphorus: 2.1 mg/dL — ABNORMAL LOW (ref 2.3–4.6)

## 2014-08-22 LAB — TSH: TSH: 5.325 u[IU]/mL — ABNORMAL HIGH (ref 0.350–4.500)

## 2014-08-22 LAB — GLUCOSE, CAPILLARY
Glucose-Capillary: 109 mg/dL — ABNORMAL HIGH (ref 70–99)
Glucose-Capillary: 111 mg/dL — ABNORMAL HIGH (ref 70–99)
Glucose-Capillary: 115 mg/dL — ABNORMAL HIGH (ref 70–99)

## 2014-08-22 LAB — MAGNESIUM: MAGNESIUM: 1.7 mg/dL (ref 1.5–2.5)

## 2014-08-22 MED ORDER — IPRATROPIUM-ALBUTEROL 0.5-2.5 (3) MG/3ML IN SOLN: 3.0000 mL | RESPIRATORY_TRACT | Status: DC | PRN

## 2014-08-22 NOTE — Progress Notes (Signed)
PULMONARY / CRITICAL CARE MEDICINE   Name: Miranda Miranda MRN: [id number] DOB: 12-Mar-1966    ADMISSION DATE:  08/24/2014  REFERRING MD :  Duke Salviaandolph  CHIEF COMPLAINT:  S/p arrest  INITIAL PRESENTATION:  49 yo female found unresponsive from cardiac arrest.  She was recently treated for Streptococcal PNA and septic shock requiring trach/PEG.  STUDIES:  2/07 Echo >> EF 30 to 35% 2/07 CT head >> cerebral edema  SIGNIFICANT EVENTS: 2/05 Admit  SUBJECTIVE:  Remains off sedation.  VITAL SIGNS: Temp:  [97.5 F (36.4 C)-99.5 F (37.5 C)] 97.5 F (36.4 C) (02/08 0800) Pulse Rate:  [79-116] 88 (02/08 0900) Resp:  [15-28] 20 (02/08 0900) BP: (88-145)/(52-99) 88/52 mmHg (02/08 0900) SpO2:  [94 %-100 %] 100 % (02/08 0900) FiO2 (%):  [40 %] 40 % (02/08 0900) Weight:  [188 lb 7.9 oz (85.5 kg)] 188 lb 7.9 oz (85.5 kg) (02/08 0414) HEMODYNAMICS: CVP:  [12 mmHg-14 mmHg] 12 mmHg VENTILATOR SETTINGS: Vent Mode:  [-] PRVC FiO2 (%):  [40 %] 40 % Set Rate:  [20 bmp] 20 bmp Vt Set:  [500 mL] 500 mL PEEP:  [8 cmH20] 8 cmH20 Plateau Pressure:  [22 cmH20-27 cmH20] 22 cmH20 INTAKE / OUTPUT:  Intake/Output Summary (Last 24 hours) at 08/22/14 0931 Last data filed at 08/22/14 0900  Gross per 24 hour  Intake 2795.75 ml  Output   3305 ml  Net -509.25 ml    PHYSICAL EXAMINATION: General: no distress Neuro: no gag, no response to painful stimuli HEENT: pupils reactive, trach site clean Cardiovascular: regular Lungs: no wheeze  Abdomen:  Soft, peg site clean  Musculoskeletal: 1+ edema Skin: no rashes  LABS:  CBC  Recent Labs Lab 08/20/14 0417 08/21/14 0314 08/22/14 0400  WBC 12.4* 11.4* 9.1  HGB 9.7* 9.3* 8.9*  HCT 30.5* 28.7* 27.1*  PLT 286 267 261   Coag's  Recent Labs Lab 08-05-2014 2348 08/20/14 0842  APTT 34  --   INR 1.37 1.22   BMET  Recent Labs Lab 08/20/14 0417 08/21/14 0314 08/22/14 0400  NA 143 141 138  K 3.6 3.2* 2.6*  CL 116* 111 103  CO2 22 26 30    BUN 15 9 12   CREATININE 0.51 0.38* 0.35*  GLUCOSE 151* 133* 108*   Electrolytes  Recent Labs Lab 08/20/14 0417 08/20/14 1830 08/21/14 0314 08/22/14 0400  CALCIUM 7.8*  --  8.4 8.3*  MG 1.5 2.6* 2.3 1.7  PHOS 3.0  --  2.1* 2.1*   Sepsis Markers  Recent Labs Lab 08-05-2014 2348  LATICACIDVEN 1.8   ABG  Recent Labs Lab 08/20/14 0042 08/20/14 0338 08/21/14 0354  PHART 7.315* 7.317* 7.352  PCO2ART 44.6 41.4 37.2  PO2ART 85.6 83.4 62.0*   Liver Enzymes  Recent Labs Lab 08-05-2014 2348 08/21/14 0314 08/22/14 0400  AST 757* 112* 34  ALT 561* 326* 193*  ALKPHOS 107 112 114  BILITOT 0.8 0.6 0.6  ALBUMIN 2.1* 2.1* 1.9*   Cardiac Enzymes  Recent Labs Lab 08-05-2014 2348 08/20/14 0417 08/20/14 1800  TROPONINI 0.36* 0.29* 0.15*   Glucose  Recent Labs Lab 08/21/14 1157 08/21/14 1624 08/21/14 1949 08/22/14 0015 08/22/14 0340 08/22/14 0744  GLUCAP 148* 116* 112* 109* 111* 115*    Imaging Ct Head Wo Contrast  08/21/2014   CLINICAL DATA:  Initial evaluation after cardiac arrest, patient in a coma and not responsive  EXAM: CT HEAD WITHOUT CONTRAST  TECHNIQUE: Contiguous axial images were obtained from the base of the skull through the vertex  without intravenous contrast.  COMPARISON:  None.  FINDINGS: There is no evidence of hemorrhage or extra-axial fluid. There is generalized decreased sulcation. There is symmetric mild low attenuation in the deep periventricular white matter. There is no focal attenuation abnormality. The ventricles appear relatively thin. Calvarium is intact. Mild inflammatory change in the left sphenoid sinus.  IMPRESSION: Findings are suggestive of diffuse anoxic injury with associated cerebral edema.   Electronically Signed   By: Esperanza Heir M.D.   On: 08/21/2014 18:15   ASSESSMENT:  Acute on chronic respiratory failure 2nd to cardiac arrest. S/p Trach. HCAP with recent episode of Streptococcus PNA with septic shock. Anoxic  encephalopathy Myoclonic seizures Coma Respiratory leading to cardiac arrest Elevated troponin 2nd to demand ischemia Hypokalemia Hypomagnesemia Shock liver Anemia of critical illness and chronic disease UTI Hx of hypothyroidism Hyperglycemia  PLAN:  DNR, no escalation of care Defer further lab tests, CXRs Continue current tx until husband can arrive to discuss further goals of care   Summary: Updated pt's husband over phone about CT head finding.  Explained that this is consistent with severe anoxic brain injury, and she will not be able to recover neurologic function.  He has agreed to DNR status in the event of cardiac arrest.  We have set meeting for 08/23/14 to further discuss goals of care.  CC time 35 minutes.  Coralyn Helling, MD [facility] Pulmonary/Critical Care 08/22/2014, 9:54 AM Pager:  [phone] After 3pm call: [phone]

## 2014-08-22 NOTE — Clinical Documentation Improvement (Signed)
Pt admitted 2/5 in transfer from outside facility due to "cardiorespiratory arrest from respiratory arrest as driving factor". Progress notes 08/20/14 and 08/21/14 states "right basilar atx possible developing HCAP ? Aspiration event"; antibiotics initiated.  Please clarify if the pneumonia / aspiration was present on admission or developed after admission.     Thank you, Doy MinceVangela Burnie Therien, RN [phone] Clinical Documentation Specialist

## 2014-08-22 NOTE — Progress Notes (Signed)
RT notified RN that pt had vomited. RN found ~2-4oz of tan colored, tubing feed emesis coming from pt's mouth. RN performed oral care, suctioned pt's lung, and turned tube feeding off. PRN zofran given. Dr. Brooks SailorsFeinstien notified. Verbal ordered given to hold tube feeding for 4hrs. Will continue to monitor and assess closely.

## 2014-08-22 NOTE — Progress Notes (Signed)
CRITICAL VALUE ALERT  Critical value received:  K+ 2.6  Date of notification:  08/22/2014  Time of notification:  0800  Critical value read back:Yes.    Nurse who received alert:  C. Armitage  MD notified (1st page):  Dr. Vassie LollAlva  Time of first page:  0845  Responding MD:  Dr. Vassie LollAlva  Time MD responded:  [phone]

## 2014-08-22 NOTE — Clinical Documentation Improvement (Signed)
UTI documented in chart.  EMS record reflects [facility].  Please clarify if the UTI is related to the patient's indwelling catheter and document in your progress note and carry over to the discharge summary.    Thank you, Doy MinceVangela Hayven Fatima, RN [phone] Clinical Documentation Specialist

## 2014-08-23 LAB — CULTURE, RESPIRATORY W GRAM STAIN

## 2014-08-23 LAB — CULTURE, RESPIRATORY: GRAM STAIN: NONE SEEN

## 2014-08-23 NOTE — Progress Notes (Signed)
PULMONARY / CRITICAL CARE MEDICINE   Name: Miranda Miranda MRN: [id number] DOB: May 02, 1966    ADMISSION DATE:  08/18/2014  REFERRING MD :  Duke Salvia  CHIEF COMPLAINT:  S/p arrest  INITIAL PRESENTATION:  49 yo female found unresponsive from cardiac arrest.  She was recently treated for Streptococcal PNA (present prior to admission) and septic shock requiring trach/PEG.  STUDIES:  2/07 Echo >> EF 30 to 35% 2/07 CT head >> cerebral edema  SIGNIFICANT EVENTS: 2/05 Admit  SUBJECTIVE:  Remains off sedation.  VITAL SIGNS: Temp:  [98.1 F (36.7 C)-99.3 F (37.4 C)] 98.8 F (37.1 C) (02/09 0400) Pulse Rate:  [69-122] 69 (02/09 0800) Resp:  [19-25] 20 (02/09 0800) BP: (88-151)/(52-109) 106/59 mmHg (02/09 0800) SpO2:  [91 %-100 %] 100 % (02/09 0800) FiO2 (%):  [40 %] 40 % (02/09 0800) Weight:  [190 lb 4.1 oz (86.3 kg)] 190 lb 4.1 oz (86.3 kg) (02/09 0321) VENTILATOR SETTINGS: Vent Mode:  [-] PRVC FiO2 (%):  [40 %] 40 % Set Rate:  [20 bmp] 20 bmp Vt Set:  [500 mL] 500 mL PEEP:  [8 cmH20] 8 cmH20 Plateau Pressure:  [20 cmH20-30 cmH20] 20 cmH20 INTAKE / OUTPUT:  Intake/Output Summary (Last 24 hours) at 08/23/14 0847 Last data filed at 08/23/14 0800  Gross per 24 hour  Intake   2445 ml  Output   1695 ml  Net    750 ml    PHYSICAL EXAMINATION: General: no distress Neuro: no gag, no response to painful stimuli HEENT: pupils reactive, trach site clean Cardiovascular: regular Lungs: no wheeze  Abdomen:  Soft, peg site clean  Musculoskeletal: 1+ edema Skin: no rashes  LABS:  CBC  Recent Labs Lab 08/20/14 0417 08/21/14 0314 08/22/14 0400  WBC 12.4* 11.4* 9.1  HGB 9.7* 9.3* 8.9*  HCT 30.5* 28.7* 27.1*  PLT 286 267 261   Coag's  Recent Labs Lab 08/29/2014 2348 08/20/14 0842  APTT 34  --   INR 1.37 1.22   BMET  Recent Labs Lab 08/20/14 0417 08/21/14 0314 08/22/14 0400  NA 143 141 138  K 3.6 3.2* 2.6*  CL 116* 111 103  CO2 BUN CREATININE 0.51 0.38* 0.35*  GLUCOSE 151* 133* 108*   Electrolytes  Recent Labs Lab 08/20/14 0417 08/20/14 1830 08/21/14 0314 08/22/14 0400  CALCIUM 7.8*  --  8.4 8.3*  MG 1.5 2.6* 2.3 1.7  PHOS 3.0  --  2.1* 2.1*   Sepsis Markers  Recent Labs Lab 09/07/2014 2348  LATICACIDVEN 1.8   ABG  Recent Labs Lab 08/20/14 0042 08/20/14 0338 08/21/14 0354  PHART 7.315* 7.317* 7.352  PCO2ART 44.6 41.4 37.2  PO2ART 85.6 83.4 62.0*   Liver Enzymes  Recent Labs Lab 08/30/2014 2348 08/21/14 0314 08/22/14 0400  AST 757* 112* 34  ALT 561* 326* 193*  ALKPHOS 107 112 114  BILITOT 0.8 0.6 0.6  ALBUMIN 2.1* 2.1* 1.9*   Cardiac Enzymes  Recent Labs Lab 08/22/2014 2348 08/20/14 0417 08/20/14 1800  TROPONINI 0.36* 0.29* 0.15*   Glucose  Recent Labs Lab 08/21/14 1157 08/21/14 1624 08/21/14 1949 08/22/14 0015 08/22/14 0340 08/22/14 0744  GLUCAP 148* 116* 112* 109* 111* 115*    Imaging Dg Chest Port 1 View  08/22/2014   CLINICAL DATA:  Pneumonia.  EXAM: PORTABLE CHEST - 1 VIEW  COMPARISON:  08/20/2014.  FINDINGS: Tracheostomy tube, left IJ line in stable position. Cardiomegaly with normal pulmonary vascularity. Partial clearing of pulmonary interstitial  and alveolar edema. Residual interstitial edema noted. Small left pleural effusion cannot be excluded. No pneumothorax.  IMPRESSION: 1. Lines and tubes in stable position. 2. Partial clearing of bilateral pulmonary edema. Underlying pneumonitis cannot be excluded. Small left pleural effusion.   Electronically Signed   By: Maisie Fushomas  Register   On: 08/22/2014 07:23   ASSESSMENT:  Acute on chronic respiratory failure 2nd to cardiac arrest. S/p Trach. HCAP present prior to admission with recent episode of Streptococcus PNA with septic shock. Anoxic encephalopathy Myoclonic seizures Coma Respiratory leading to cardiac arrest Elevated troponin 2nd to demand ischemia Hypokalemia Hypomagnesemia Shock liver Anemia of critical  illness and chronic disease UTI Hx of hypothyroidism Hyperglycemia  PLAN:  DNR, no escalation of care Defer further lab tests, CXRs Tentative plan for family to come this PM to discuss goals of care, and likely transition to comfort measures  Coralyn HellingVineet Jaylei Fuerte, MD [facility] Pulmonary/Critical Care 08/23/2014, 8:47 AM Pager:  [phone] After 3pm call: [phone]

## 2014-08-24 DIAGNOSIS — Z66 Do not resuscitate: Secondary | ICD-10-CM

## 2014-08-24 DIAGNOSIS — Z515 Encounter for palliative care: Secondary | ICD-10-CM

## 2014-08-24 NOTE — Progress Notes (Signed)
PULMONARY / CRITICAL CARE MEDICINE   Name: Miranda Miranda MRN: [id number] DOB: Jan 24, 1966    ADMISSION DATE:  09/05/2014  REFERRING MD :  Duke Salvia  CHIEF COMPLAINT:  S/p arrest  INITIAL PRESENTATION:  49 yo female found unresponsive from cardiac arrest.  She was recently treated for Streptococcal PNA (present prior to admission) and septic shock requiring trach/PEG.  STUDIES:  2/07 Echo >> EF 30 to 35% 2/07 CT head >> cerebral edema  SIGNIFICANT EVENTS: 2/05 Admit  SUBJECTIVE:  Remains off sedation.  VITAL SIGNS: Temp:  [98.2 F (36.8 C)-99.2 F (37.3 C)] 98.2 F (36.8 C) (02/10 0800) Pulse Rate:  [69-128] 93 (02/10 1000) Resp:  [11-24] 18 (02/10 1000) BP: (57-138)/(28-84) 119/70 mmHg (02/10 1000) SpO2:  [90 %-100 %] 98 % (02/10 1000) FiO2 (%):  [40 %] 40 % (02/10 0830) VENTILATOR SETTINGS: Vent Mode:  [-] PRVC FiO2 (%):  [40 %] 40 % Set Rate:  [12 bmp] 12 bmp Vt Set:  [500 mL] 500 mL PEEP:  [5 cmH20] 5 cmH20 Plateau Pressure:  [21 cmH20-24 cmH20] 24 cmH20 INTAKE / OUTPUT:  Intake/Output Summary (Last 24 hours) at 08/24/14 1059 Last data filed at 08/24/14 1000  Gross per 24 hour  Intake   2050 ml  Output   1350 ml  Net    700 ml    PHYSICAL EXAMINATION: General: no distress Neuro: no gag, no response to painful stimuli HEENT: pupils reactive, trach site clean Cardiovascular: regular Lungs: no wheeze  Abdomen:  Soft, peg site clean  Musculoskeletal: 1+ edema Skin: no rashes  LABS:  CBC  Recent Labs Lab 08/20/14 0417 08/21/14 0314 08/22/14 0400  WBC 12.4* 11.4* 9.1  HGB 9.7* 9.3* 8.9*  HCT 30.5* 28.7* 27.1*  PLT 286 267 261   Coag's  Recent Labs Lab 08/21/2014 2348 08/20/14 0842  APTT 34  --   INR 1.37 1.22   BMET  Recent Labs Lab 08/20/14 0417 08/21/14 0314 08/22/14 0400  NA 143 141 138  K 3.6 3.2* 2.6*  CL 116* 111 103  CO2 BUN CREATININE 0.51 0.38* 0.35*  GLUCOSE 151* 133* 108*   Electrolytes  Recent  Labs Lab 08/20/14 0417 08/20/14 1830 08/21/14 0314 08/22/14 0400  CALCIUM 7.8*  --  8.4 8.3*  MG 1.5 2.6* 2.3 1.7  PHOS 3.0  --  2.1* 2.1*   Sepsis Markers  Recent Labs Lab 08/15/2014 2348  LATICACIDVEN 1.8   ABG  Recent Labs Lab 08/20/14 0042 08/20/14 0338 08/21/14 0354  PHART 7.315* 7.317* 7.352  PCO2ART 44.6 41.4 37.2  PO2ART 85.6 83.4 62.0*   Liver Enzymes  Recent Labs Lab 09/07/2014 2348 08/21/14 0314 08/22/14 0400  AST 757* 112* 34  ALT 561* 326* 193*  ALKPHOS 107 112 114  BILITOT 0.8 0.6 0.6  ALBUMIN 2.1* 2.1* 1.9*   Cardiac Enzymes  Recent Labs Lab 08/22/2014 2348 08/20/14 0417 08/20/14 1800  TROPONINI 0.36* 0.29* 0.15*   Glucose  Recent Labs Lab 08/21/14 1157 08/21/14 1624 08/21/14 1949 08/22/14 0015 08/22/14 0340 08/22/14 0744  GLUCAP 148* 116* 112* 109* 111* 115*    Imaging No results found.   ASSESSMENT:  Acute on chronic respiratory failure 2nd to cardiac arrest. S/p Trach. HCAP with Stenotrophomonas present prior to admission with recent episode of Streptococcus PNA with septic shock. Pantoea UTI Anoxic encephalopathy Myoclonic seizures Coma Respiratory leading to cardiac arrest Elevated troponin 2nd to demand ischemia Hypokalemia Hypomagnesemia Shock liver Anemia of critical illness  and chronic disease UTI Hx of hypothyroidism Hyperglycemia  PLAN:  DNR, no escalation of care Defer further lab tests, CXRs D/c Abx Family meeting planned for 2/11 when her brother and [place]  [place], MD [facility] 08/24/2014, 10:59 AM Pager:  [phone] After 3pm call: [phone]

## 2014-08-25 DIAGNOSIS — Z515 Encounter for palliative care: Secondary | ICD-10-CM

## 2014-08-25 MED ORDER — FENTANYL BOLUS VIA INFUSION
50.0000 ug | INTRAVENOUS | Status: DC | PRN
  Filled 2014-08-25: qty 200

## 2014-08-25 MED ORDER — ACETAMINOPHEN 325 MG PO TABS: 650.0000 mg | ORAL_TABLET | ORAL | Status: DC | PRN

## 2014-08-25 MED ORDER — LORAZEPAM BOLUS VIA INFUSION
2.0000 mg | INTRAVENOUS | Status: DC | PRN
  Filled 2014-08-25: qty 5

## 2014-08-25 MED ORDER — FENTANYL CITRATE 0.05 MG/ML IJ SOLN
50.0000 ug | INTRAMUSCULAR | Status: DC | PRN
  Administered 2014-08-25 (×2): 100 ug via INTRAVENOUS
  Filled 2014-08-25 (×2): qty 2

## 2014-08-25 MED ORDER — LORAZEPAM 2 MG/ML IJ SOLN
2.0000 mg | INTRAMUSCULAR | Status: DC | PRN
  Administered 2014-08-25: 2 mg via INTRAVENOUS
  Filled 2014-08-25: qty 1

## 2014-08-26 LAB — CULTURE, BLOOD (ROUTINE X 2)
CULTURE: NO GROWTH
Culture: NO GROWTH

## 2014-08-29 NOTE — Discharge Summary (Signed)
PULMONARY / CRITICAL CARE MEDICINE  49 yo female presented to [facility] and then [facility]2014 after being found unresponsive from cardiac arrest.  She was recently treated for Streptococcal PNA present prior to this admission and septic shock requiring trach/PEG.  CT head showed cerebral edema.  She remained in comatose state.  Family was updated about her condition.  She was made DNR and transitioned to comfort measures only.  She subsequently expired on 08/28/2014 at 1:26 PM.  Final Diagnoses:  Acute on chronic respiratory failure 2nd to cardiac arrest. S/p Trach. HCAP with Stenotrophomonas present prior to admission with recent episode of Streptococcus PNA with septic shock. Pantoea UTI Anoxic encephalopathy Myoclonic seizures Coma Respiratory leading to cardiac arrest Elevated troponin 2nd to demand ischemia Hypokalemia Hypomagnesemia Shock liver Anemia of critical illness and chronic disease Hx of hypothyroidism Hyperglycemia  Coralyn HellingVineet Simrin Vegh, MD [facility] 08/29/2014, 3:58 PM

## 2014-09-05 ENCOUNTER — Telehealth: Payer: Self-pay

## 2014-09-05 NOTE — Telephone Encounter (Signed)
Received original death certificate 1/47/822/22/16 from [facility] for Dr. Craige CottaSood to sign.  Dr. Craige CottaSood signed on 09/05/14.  Called funeral home for pick up.

## 2014-09-13 NOTE — Progress Notes (Signed)
Time of death 1326, verified with Loura BackJennifer Wilder, RN. Family at the bedside. Emotional support, chaplain, social work, and comfort cart provided.

## 2014-09-13 NOTE — Progress Notes (Signed)
PULMONARY / CRITICAL CARE MEDICINE  49 yo female presented to [facility] and then transferred to [facility] on 08/24/2014 after being found unresponsive from cardiac arrest.  She was recently treated for Streptococcal PNA present prior to this admission and septic shock requiring trach/PEG.  CT head showed cerebral edema.  She remained in comatose state.  Family was updated about her condition.  She was made DNR and transitioned to comfort measures only.  SUBJECTIVE:  Remains off sedation.  VITAL SIGNS: Temp:  [98.9 F (37.2 C)-101.7 F (38.7 C)] 99.8 F (37.7 C) (02/11 0756) Pulse Rate:  [83-111] 96 (02/11 0900) Resp:  [13-25] 17 (02/11 0900) BP: (102-141)/(60-97) 132/97 mmHg (02/11 0900) SpO2:  [95 %-100 %] 96 % (02/11 0900) FiO2 (%):  [30 %-40 %] 30 % (02/11 0756) Weight:  [189 lb 6 oz (85.9 kg)] 189 lb 6 oz (85.9 kg) (02/11 0218) VENTILATOR SETTINGS: Vent Mode:  [-] PRVC FiO2 (%):  [30 %-40 %] 30 % Set Rate:  [12 bmp] 12 bmp Vt Set:  [500 mL] 500 mL PEEP:  [5 cmH20] 5 cmH20 Plateau Pressure:  [21 cmH20-25 cmH20] 25 cmH20 INTAKE / OUTPUT:  Intake/Output Summary (Last 24 hours) at 09-22-14 1045 Last data filed at 22-Sep-2014 0800  Gross per 24 hour  Intake   1270 ml  Output   1575 ml  Net   -305 ml    PHYSICAL EXAMINATION: General: no distress Neuro: no gag, no response to painful stimuli HEENT: pupils reactive, trach site clean Cardiovascular: regular Lungs: no wheeze  Abdomen:  Soft, peg site clean  Musculoskeletal: 1+ edema Skin: no rashes  LABS:  CBC  Recent Labs Lab 08/20/14 0417 08/21/14 0314 08/22/14 0400  WBC 12.4* 11.4* 9.1  HGB 9.7* 9.3* 8.9*  HCT 30.5* 28.7* 27.1*  PLT 286 267 261   Coag's  Recent Labs Lab 08/27/2014 2348 08/20/14 0842  APTT 34  --   INR 1.37 1.22   BMET  Recent Labs Lab 08/20/14 0417 08/21/14 0314 08/22/14 0400  NA 143 141 138  K 3.6 3.2* 2.6*  CL 116* 111 103  CO2 BUN CREATININE 0.51 0.38*  0.35*  GLUCOSE 151* 133* 108*   Electrolytes  Recent Labs Lab 08/20/14 0417 08/20/14 1830 08/21/14 0314 08/22/14 0400  CALCIUM 7.8*  --  8.4 8.3*  MG 1.5 2.6* 2.3 1.7  PHOS 3.0  --  2.1* 2.1*   Sepsis Markers  Recent Labs Lab 08/18/2014 2348  LATICACIDVEN 1.8   ABG  Recent Labs Lab 08/20/14 0042 08/20/14 0338 08/21/14 0354  PHART 7.315* 7.317* 7.352  PCO2ART 44.6 41.4 37.2  PO2ART 85.6 83.4 62.0*   Liver Enzymes  Recent Labs Lab 08/24/2014 2348 08/21/14 0314 08/22/14 0400  AST 757* 112* 34  ALT 561* 326* 193*  ALKPHOS 107 112 114  BILITOT 0.8 0.6 0.6  ALBUMIN 2.1* 2.1* 1.9*   Cardiac Enzymes  Recent Labs Lab 09/11/2014 2348 08/20/14 0417 08/20/14 1800  TROPONINI 0.36* 0.29* 0.15*   Glucose  Recent Labs Lab 08/21/14 1157 08/21/14 1624 08/21/14 1949 08/22/14 0015 08/22/14 0340 08/22/14 0744  GLUCAP 148* 116* 112* 109* 111* 115*    ASSESSMENT:  Acute on chronic respiratory failure 2nd to cardiac arrest. S/p Trach. HCAP with Stenotrophomonas present prior to admission with recent episode of Streptococcus PNA with septic shock. Pantoea UTI Anoxic encephalopathy Myoclonic seizures Coma Respiratory leading to cardiac arrest Elevated troponin 2nd to demand ischemia Hypokalemia Hypomagnesemia Shock liver Anemia  of critical illness and chronic disease Hx of hypothyroidism Hyperglycemia  PLAN:  DNR Comfort measures only PRN ativan, fentanyl PRN tylenol for fever  Summary: I discussed with her husband and brother about process for transition to comfort measures.  Discussed with them that time course for dying process is difficult to predict.  Also explained that she might need to be transferred to floor bed, depending on her status after vent support removed.   Coralyn HellingVineet Layson Bertsch, MD [facility] 2014/08/15, 10:45 AM Pager:  [phone] After 3pm call: [phone]

## 2014-09-13 NOTE — Progress Notes (Signed)
Nutrition Brief Note  Chart reviewed. Pt now transitioning to comfort care.  No further nutrition interventions warranted at this time.  Please re-consult as needed.   Debby Clyne RD, LDN, CNSC [phone] Pager [phone] After Hours Pager    

## 2014-09-13 NOTE — Clinical Social Work Psychosocial (Signed)
Clinical Social Work Department BRIEF PSYCHOSOCIAL ASSESSMENT 09/18/14  Patient:  Miranda Miranda, Miranda Miranda     Account Number:  [id number]     Admit date:  08/24/2014  Clinical Social Worker:  Elam Dutch  Date/Time:  Sep 18, 2014 11:15 AM  Referred by:  Physician  Date Referred:  09/18/14 Referred for  Other - See comment   Other Referral:   Patient from SNF;  Comfort care- Terminal wean from vent   Interview type:  Family Other interview type:   Husband, brother and sister in law    PSYCHOSOCIAL DATA Living Status:  FACILITY Admitted from facility:  [facility] Level of care:  [place] Primary support name:  [name]   [phone] Primary support relationship to patient:  SPOUSE Degree of support available:   Strong support    CURRENT CONCERNS Current Concerns  Other - See comment   Other Concerns:   Comfort care and extubation from ventilator    [place] / PLAN 49 year old female- resident of [facility] and [place]. CSW had spoken to [place]- Admissions for [employer] several days ago and he stated that patient was admitted only briefly to the facility from [facility]- she was their patient for less than 5 hours before she was found to be unresponsive. Patient has a PEG and Trach; was brought to [facility] and placed on a ventilator.  CSW was notified by nursing this morning that MD has discussed with patient's husband and brother that a terminal wean from the ventilator is indicated. The family requested CSW's asisstance to provide information re: cremation, body donation or burial assistance.  CSW met with patient's husband, brother and sister in law and provided information re: above.  Patient did not have any insurance for burial per husband and he stated that she never spoke about her thoughts re: cremation. CSW provided information re: general costs for cremation and burials;  patient would not be able to  be "donated to science" as  arrangements had not been made in the past. She is not an organ doner per nursing.  CSW encouraged patient's family to contact their "family funeral home" as a place to start and let them know the current situation for patient (no insurance) and see if they can provide financial information or assistance. Husband was provided a list of funeral homes in surrounding counties as well who might be able to assist with funeral arrangements.  Patient's husband was noted to be very quiet and verbalize feelings of sadness over his wife's deteriorating and need for extubation.  He stated "I don't even know what to think or feel right now- they tell me her brain doesn't work anymore."  CSW provided support and encouraged family to speak about patient's life and their feelings.  They were very appreciative of CSW's visit and informaiton provided.   Assessment/plan status:  Psychosocial Support/Ongoing Assessment of Needs Other assessment/ plan:   Information/referral to community resources:   Information provided re: area funeral homes and cremation services    PATIENTS/FAMILYS RESPONSE TO PLAN OF CARE: Patient is unresponsive and currently on ventilator support.  Husband states that the physician spoke clearly about patient's condition and terminal status and he feels that he was able to explain things appropriately and in a thorough manner. He expresses feelings of sadness and loss but recognizes that he does not want patient to "suffer". Husband and brother were tearful but attempting to be strong.  Family  does not want to be in the room when they remove the ventilator tube but plan to return to the room after extubation.  CSW willl be available to provide support as needed.

## 2014-09-13 NOTE — Progress Notes (Signed)
Patient taken off vent at this time per withdrawal of life order. Patient on room air no discomfort noted. RN at bedside

## 2014-09-13 DEATH — deceased

## 2016-02-17 IMAGING — CR DG CHEST 1V PORT
1 series · 1 of 1 positions shown · non-contrast
Comparison: One day prior at 3399 hr

CLINICAL DATA: Respiratory failure.

EXAM:
PORTABLE CHEST - 1 VIEW

[AP]
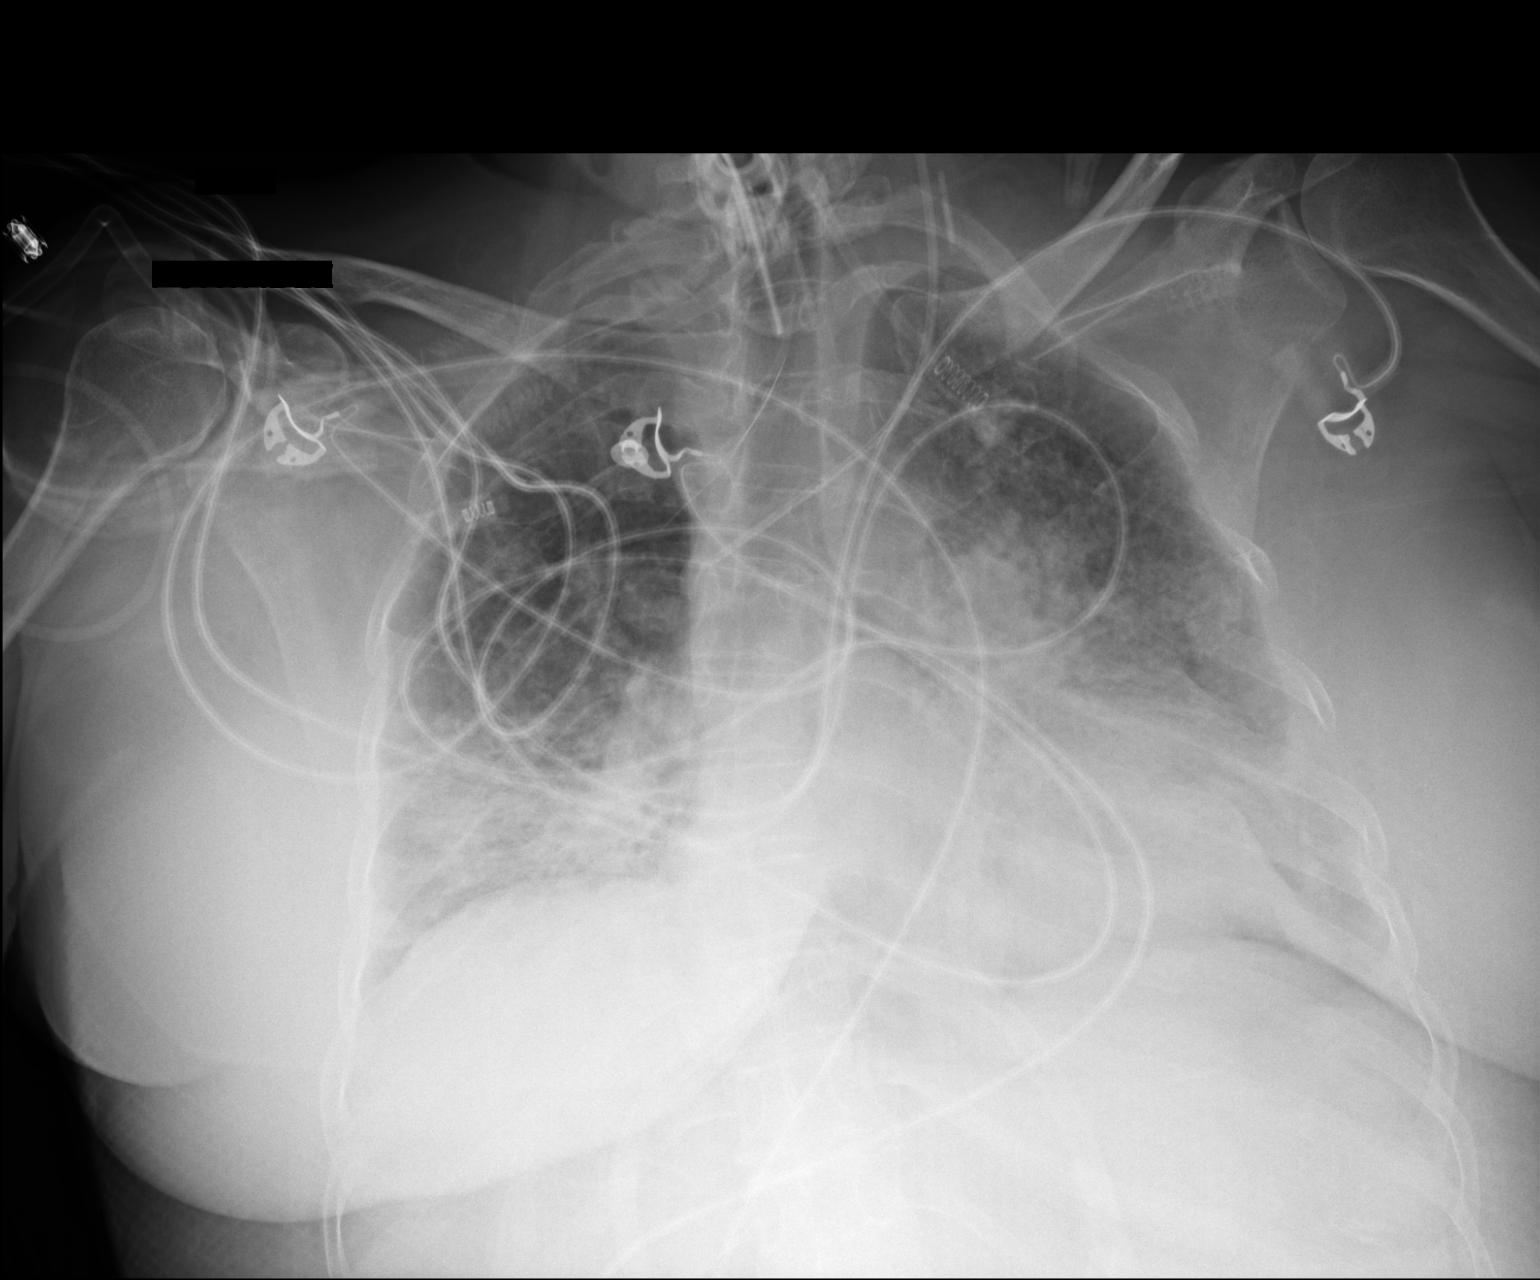

[1 of 1 positions shown; findings below may reference images not displayed]

FINDINGS: Tracheostomy tube remains the thoracic inlet. There are multiple
overlying monitoring devices, there is a probable left internal
jugular central venous catheter tip at the proximal SVC.
Cardiomegaly is unchanged. Interstitial edema is unchanged. Left
pleural effusion and atelectasis, unchanged. There is worsening
right atelectasis. No pneumothorax.
IMPRESSION: 1. Worsening atelectasis at the right lung base.
2. Unchanged cardiomegaly, interstitial edema, left pleural effusion
and atelectasis.
3. New left internal jugular central venous catheter with tip over
the SVC.

## 2016-02-18 IMAGING — CT CT HEAD W/O CM
2 series · 16 of 30 positions shown, 18 images · non-contrast
Comparison: None.

CLINICAL DATA: Initial evaluation after cardiac arrest, patient in
a coma and not responsive

EXAM:
CT HEAD WITHOUT CONTRAST
TECHNIQUE: Contiguous axial images were obtained from the base of the skull
through the vertex without intravenous contrast.

[Series 201: head w/o, idose (1) · axial · non-contrast · 0.49mm/px · z∈[+79,+204]mm · 8 of 33 slices shown, 10 images]
[im 4/33  brain]
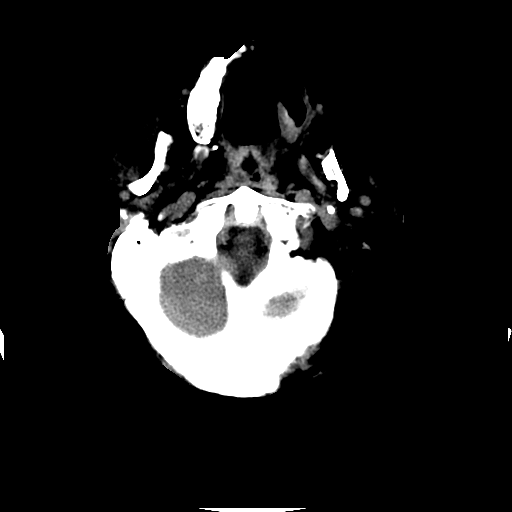
[im 4/33  bone]
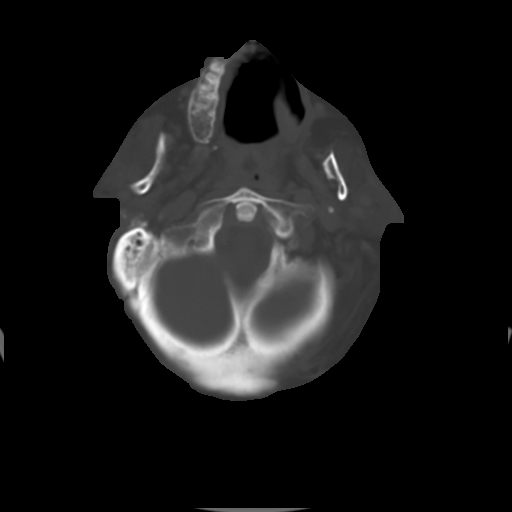
[im 8/33  brain]
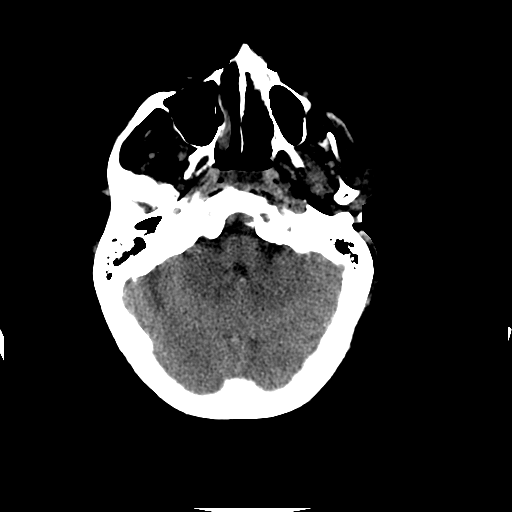
[im 11/33  brain]
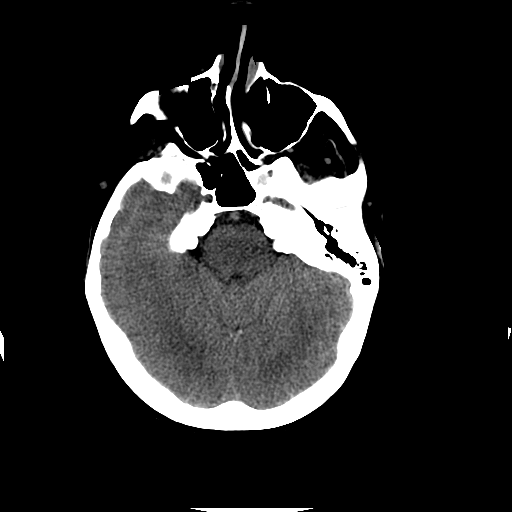
[im 15/33  brain]
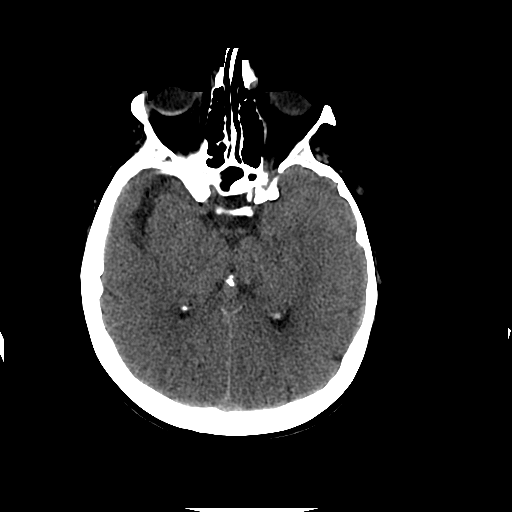
[im 18/33  brain]
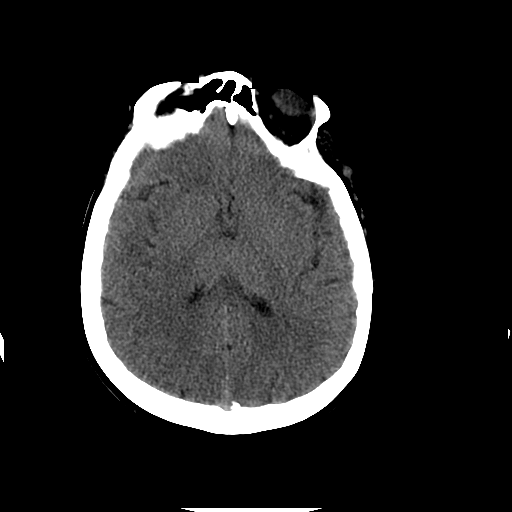
[im 18/33  bone]
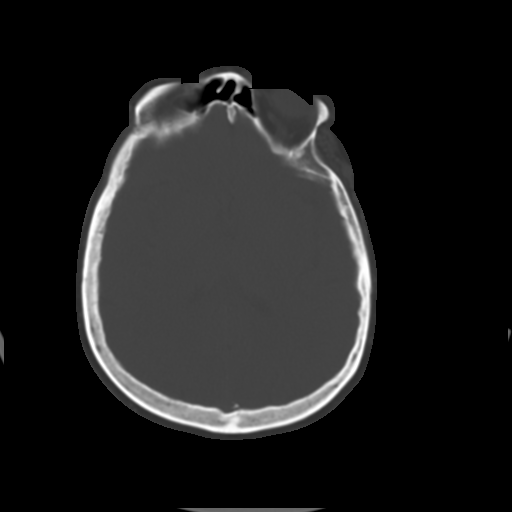
[im 22/33  brain]
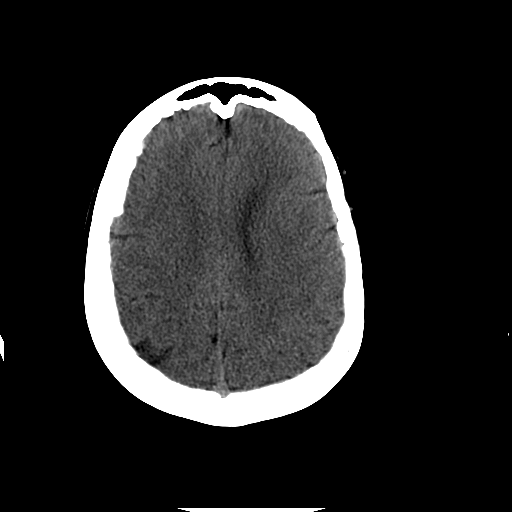
[im 25/33  brain]
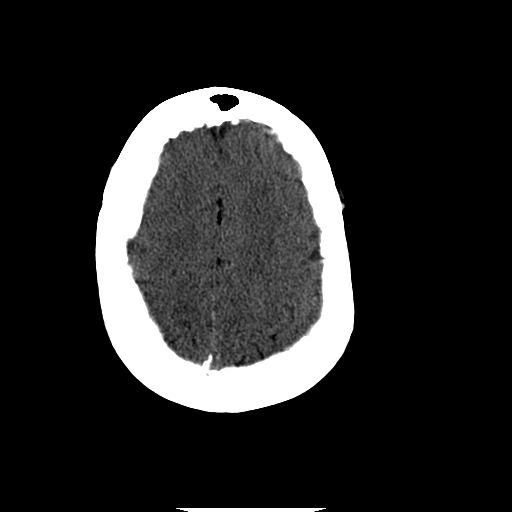
[im 29/33  brain]
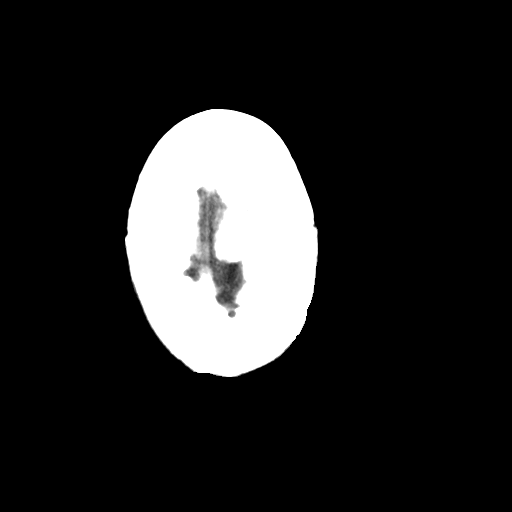

[Series 202: head w/o bone, idose (1) · axial · non-contrast · 0.49mm/px · z∈[+78,+208]mm · 8 of 66 slices shown]
[im 7/66  bone]
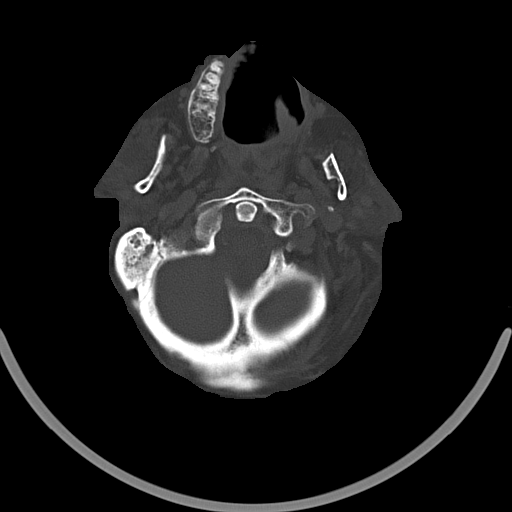
[im 14/66  bone]
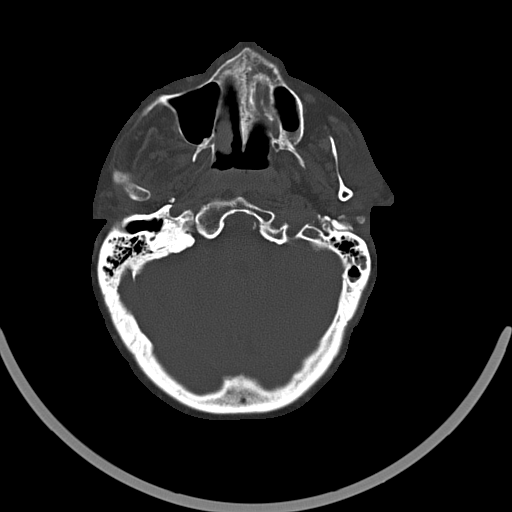
[im 21/66  bone]
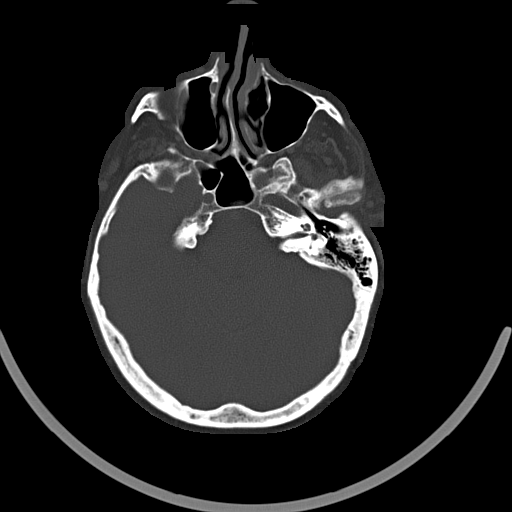
[im 28/66  bone]
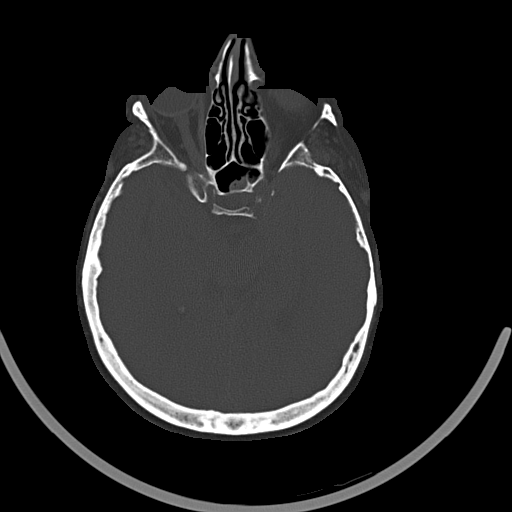
[im 38/66  bone]
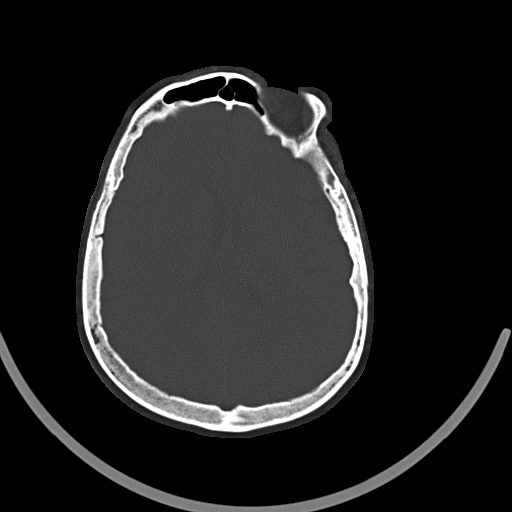
[im 45/66  bone]
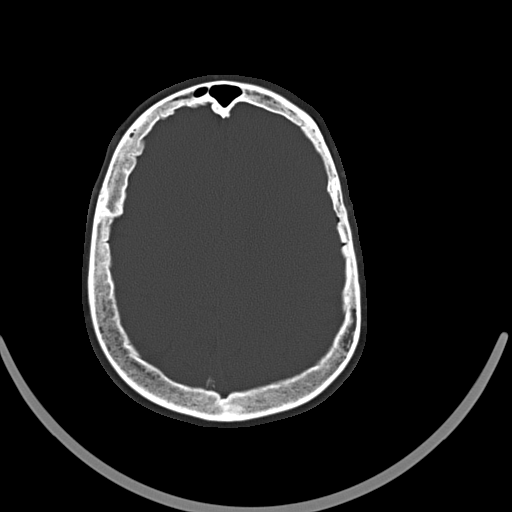
[im 52/66  bone]
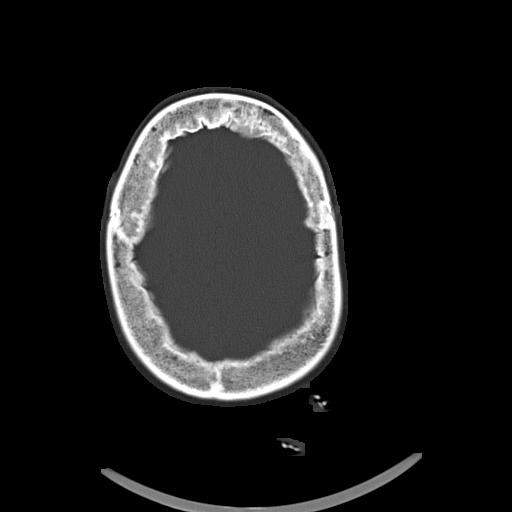
[im 59/66  bone]
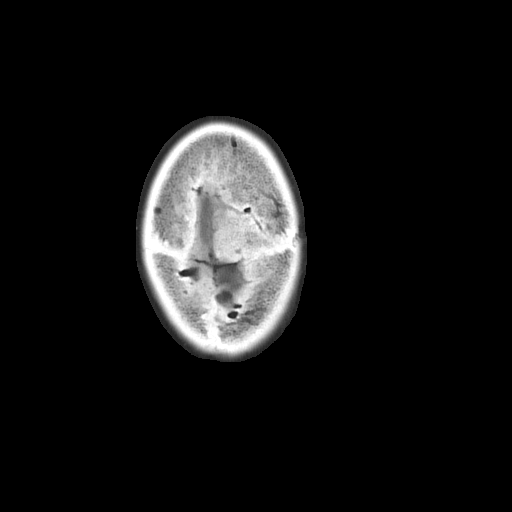

[16 of 30 positions shown; findings below may reference images not displayed]

FINDINGS: There is no evidence of hemorrhage or extra-axial fluid. There is
generalized decreased sulcation. There is symmetric mild low
attenuation in the deep periventricular white matter. There is no
focal attenuation abnormality. The ventricles appear relatively
thin. Calvarium is intact. Mild inflammatory change in the left
sphenoid sinus.
IMPRESSION: Findings are suggestive of diffuse anoxic injury with associated
cerebral edema.

## 2016-02-19 IMAGING — CR DG CHEST 1V PORT
1 series · 1 of 1 positions shown · non-contrast
Comparison: 08/20/2014.

CLINICAL DATA: Pneumonia.

EXAM:
PORTABLE CHEST - 1 VIEW

[AP]
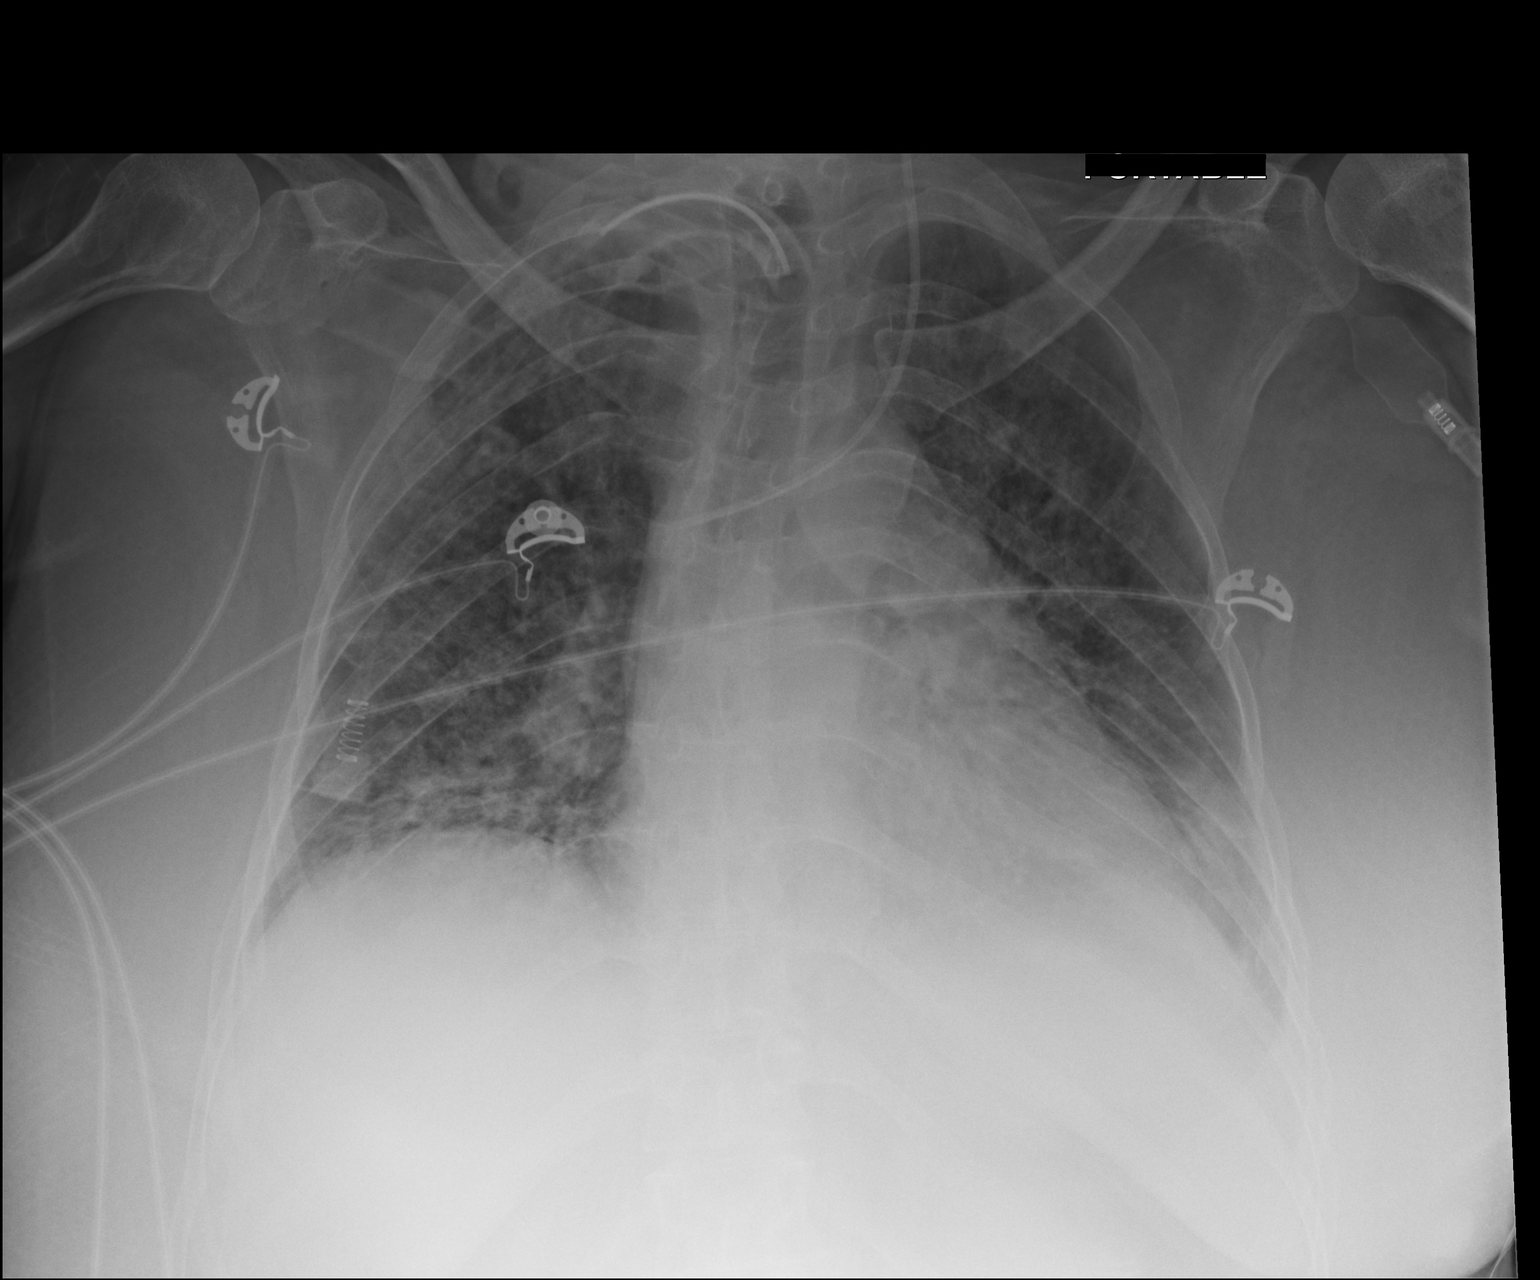

[1 of 1 positions shown; findings below may reference images not displayed]

FINDINGS: Tracheostomy tube, left IJ line in stable position. Cardiomegaly
with normal pulmonary vascularity. Partial clearing of pulmonary
interstitial and alveolar edema. Residual interstitial edema noted.
Small left pleural effusion cannot be excluded. No pneumothorax.
IMPRESSION: 1. Lines and tubes in stable position.
2. Partial clearing of bilateral pulmonary edema. Underlying
pneumonitis cannot be excluded. Small left pleural effusion.
